# Patient Record
Sex: Male | Born: 1975 | Race: White | Hispanic: No | Marital: Single | Smoking: Current some day smoker
Health system: Southern US, Community
[De-identification: ages and names within clinical notes are randomized; demographics above are authoritative.]

## PROBLEM LIST (undated history)

## (undated) DIAGNOSIS — F419 Anxiety disorder, unspecified: Secondary | ICD-10-CM

## (undated) HISTORY — PX: HAND SURGERY: SHX662

## (undated) HISTORY — PX: HIP SURGERY: SHX245

## (undated) HISTORY — PX: BACK SURGERY: SHX140

---

## 2003-01-28 ENCOUNTER — Emergency Department (HOSPITAL_COMMUNITY): Admission: EM | Admit: 2003-01-28 | Discharge: 2003-01-29 | Payer: Self-pay | Admitting: Emergency Medicine

## 2003-02-13 ENCOUNTER — Emergency Department (HOSPITAL_COMMUNITY): Admission: EM | Admit: 2003-02-13 | Discharge: 2003-02-13 | Payer: Self-pay | Admitting: Emergency Medicine

## 2005-10-06 ENCOUNTER — Emergency Department (HOSPITAL_COMMUNITY): Admission: EM | Admit: 2005-10-06 | Discharge: 2005-10-06 | Payer: Self-pay | Admitting: Emergency Medicine

## 2005-12-18 ENCOUNTER — Emergency Department (HOSPITAL_COMMUNITY): Admission: EM | Admit: 2005-12-18 | Discharge: 2005-12-18 | Payer: Self-pay | Admitting: Emergency Medicine

## 2006-08-13 ENCOUNTER — Emergency Department (HOSPITAL_COMMUNITY): Admission: EM | Admit: 2006-08-13 | Discharge: 2006-08-13 | Payer: Self-pay | Admitting: Emergency Medicine

## 2007-07-20 ENCOUNTER — Emergency Department (HOSPITAL_COMMUNITY): Admission: EM | Admit: 2007-07-20 | Discharge: 2007-07-20 | Payer: Self-pay | Admitting: Emergency Medicine

## 2007-11-01 ENCOUNTER — Emergency Department (HOSPITAL_COMMUNITY): Admission: EM | Admit: 2007-11-01 | Discharge: 2007-11-01 | Payer: Self-pay | Admitting: Emergency Medicine

## 2008-02-13 ENCOUNTER — Emergency Department (HOSPITAL_COMMUNITY): Admission: EM | Admit: 2008-02-13 | Discharge: 2008-02-13 | Payer: Self-pay | Admitting: Emergency Medicine

## 2008-02-27 ENCOUNTER — Emergency Department (HOSPITAL_COMMUNITY): Admission: EM | Admit: 2008-02-27 | Discharge: 2008-02-27 | Payer: Self-pay | Admitting: Emergency Medicine

## 2008-06-14 ENCOUNTER — Emergency Department (HOSPITAL_COMMUNITY): Admission: EM | Admit: 2008-06-14 | Discharge: 2008-06-14 | Payer: Self-pay | Admitting: Emergency Medicine

## 2008-06-30 ENCOUNTER — Emergency Department (HOSPITAL_COMMUNITY): Admission: EM | Admit: 2008-06-30 | Discharge: 2008-06-30 | Payer: Self-pay | Admitting: Emergency Medicine

## 2009-01-07 ENCOUNTER — Emergency Department (HOSPITAL_COMMUNITY): Admission: EM | Admit: 2009-01-07 | Discharge: 2009-01-07 | Payer: Self-pay | Admitting: Emergency Medicine

## 2009-04-22 ENCOUNTER — Emergency Department (HOSPITAL_COMMUNITY): Admission: EM | Admit: 2009-04-22 | Discharge: 2009-04-22 | Payer: Self-pay | Admitting: Emergency Medicine

## 2010-01-11 ENCOUNTER — Emergency Department (HOSPITAL_COMMUNITY): Admission: EM | Admit: 2010-01-11 | Discharge: 2010-01-11 | Payer: Self-pay | Admitting: Emergency Medicine

## 2010-02-05 ENCOUNTER — Emergency Department (HOSPITAL_COMMUNITY): Admission: EM | Admit: 2010-02-05 | Discharge: 2010-02-05 | Payer: Self-pay | Admitting: Emergency Medicine

## 2010-06-05 LAB — BASIC METABOLIC PANEL
BUN: 14 mg/dL (ref 6–23)
CO2: 26 mEq/L (ref 19–32)
Calcium: 9.2 mg/dL (ref 8.4–10.5)
Chloride: 109 mEq/L (ref 96–112)
Creatinine, Ser: 0.85 mg/dL (ref 0.4–1.5)
GFR calc Af Amer: >60 mL/min (ref >60)
GFR calc non Af Amer: >60 mL/min (ref >60)
Glucose, Bld: 112 mg/dL — ABNORMAL HIGH (ref 70–99)
Potassium: 4.5 mEq/L (ref 3.5–5.1)
Sodium: 140 mEq/L (ref 135–145)

## 2010-06-05 LAB — URINALYSIS, ROUTINE W REFLEX MICROSCOPIC
Bilirubin Urine: NEGATIVE
Glucose, UA: NEGATIVE mg/dL
Hgb urine dipstick: NEGATIVE
Nitrite: NEGATIVE
Protein, ur: NEGATIVE mg/dL
Specific Gravity, Urine: 1.022 (ref 1.005–1.030)
Urobilinogen, UA: 1 mg/dL (ref 0.0–1.0)
pH: 7.5 (ref 5.0–8.0)

## 2010-06-05 LAB — DIFFERENTIAL
Basophils Absolute: 0 10*3/uL (ref 0.0–0.1)
Basophils Relative: 0 % (ref 0–1)
Eosinophils Absolute: 0.1 10*3/uL (ref 0.0–0.7)
Eosinophils Relative: 1 % (ref 0–5)
Lymphocytes Relative: 14 % (ref 12–46)
Lymphs Abs: 1.3 10*3/uL (ref 0.7–4.0)
Monocytes Absolute: 0.4 10*3/uL (ref 0.1–1.0)
Monocytes Relative: 4 % (ref 3–12)
Neutro Abs: 7.7 10*3/uL (ref 1.7–7.7)
Neutrophils Relative %: 81 % — ABNORMAL HIGH (ref 43–77)

## 2010-06-05 LAB — CBC
HCT: 45.8 % (ref 39.0–52.0)
Hemoglobin: 15.7 g/dL (ref 13.0–17.0)
MCH: 33.1 pg (ref 26.0–34.0)
MCHC: 34.3 g/dL (ref 30.0–36.0)
MCV: 96.5 fL (ref 78.0–100.0)
Platelets: 250 10*3/uL (ref 150–400)
RBC: 4.75 MIL/uL (ref 4.22–5.81)
RDW: 13.3 % (ref 11.5–15.5)
WBC: 9.5 10*3/uL (ref 4.0–10.5)

## 2010-06-05 LAB — LIPASE, BLOOD: Lipase: 23 U/L (ref 11–59)

## 2010-06-27 LAB — POCT CARDIAC MARKERS
CKMB, poc: <1 ng/mL — ABNORMAL LOW (ref 1.0–8.0)
Myoglobin, poc: 57.1 ng/mL (ref 12–200)
Troponin i, poc: <0.05 ng/mL (ref 0.00–0.09)

## 2010-07-03 LAB — POCT I-STAT, CHEM 8
BUN: 10 mg/dL (ref 6–23)
Calcium, Ion: 1.13 mmol/L (ref 1.12–1.32)
Chloride: 104 mEq/L (ref 96–112)
Creatinine, Ser: 1 mg/dL (ref 0.4–1.5)
Glucose, Bld: 81 mg/dL (ref 70–99)
HCT: 45 % (ref 39.0–52.0)
Hemoglobin: 15.3 g/dL (ref 13.0–17.0)
Potassium: 4.3 mEq/L (ref 3.5–5.1)
Sodium: 142 mEq/L (ref 135–145)
TCO2: 29 mmol/L (ref 0–100)

## 2010-07-03 LAB — CBC
HCT: 42.6 % (ref 39.0–52.0)
Hemoglobin: 14.6 g/dL (ref 13.0–17.0)
MCHC: 34.2 g/dL (ref 30.0–36.0)
MCV: 97.8 fL (ref 78.0–100.0)
Platelets: 217 10*3/uL (ref 150–400)
RBC: 4.35 MIL/uL (ref 4.22–5.81)
RDW: 13.1 % (ref 11.5–15.5)
WBC: 6 10*3/uL (ref 4.0–10.5)

## 2010-07-03 LAB — COMPREHENSIVE METABOLIC PANEL
ALT: 11 U/L (ref 0–53)
AST: 18 U/L (ref 0–37)
Albumin: 4.1 g/dL (ref 3.5–5.2)
Alkaline Phosphatase: 69 U/L (ref 39–117)
BUN: 8 mg/dL (ref 6–23)
CO2: 31 mEq/L (ref 19–32)
Calcium: 9.4 mg/dL (ref 8.4–10.5)
Chloride: 107 mEq/L (ref 96–112)
Creatinine, Ser: 0.97 mg/dL (ref 0.4–1.5)
GFR calc Af Amer: >60 mL/min (ref >60)
GFR calc non Af Amer: >60 mL/min (ref >60)
Glucose, Bld: 89 mg/dL (ref 70–99)
Potassium: 4.5 mEq/L (ref 3.5–5.1)
Sodium: 143 mEq/L (ref 135–145)
Total Bilirubin: 0.6 mg/dL (ref 0.3–1.2)
Total Protein: 6.4 g/dL (ref 6.0–8.3)

## 2010-07-03 LAB — DIFFERENTIAL
Basophils Absolute: 0 10*3/uL (ref 0.0–0.1)
Basophils Relative: 0 % (ref 0–1)
Eosinophils Absolute: 0.1 10*3/uL (ref 0.0–0.7)
Eosinophils Relative: 2 % (ref 0–5)
Lymphocytes Relative: 30 % (ref 12–46)
Lymphs Abs: 1.8 10*3/uL (ref 0.7–4.0)
Monocytes Absolute: 0.3 10*3/uL (ref 0.1–1.0)
Monocytes Relative: 6 % (ref 3–12)
Neutro Abs: 3.7 10*3/uL (ref 1.7–7.7)
Neutrophils Relative %: 62 % (ref 43–77)

## 2010-07-03 LAB — URINALYSIS, ROUTINE W REFLEX MICROSCOPIC
Bilirubin Urine: NEGATIVE
Glucose, UA: NEGATIVE mg/dL
Hgb urine dipstick: NEGATIVE
Nitrite: NEGATIVE
Protein, ur: NEGATIVE mg/dL
Specific Gravity, Urine: 1.023 (ref 1.005–1.030)
Urobilinogen, UA: 1 mg/dL (ref 0.0–1.0)
pH: 6.5 (ref 5.0–8.0)

## 2010-07-03 LAB — URINE CULTURE
Colony Count: NO GROWTH
Culture: NO GROWTH

## 2010-07-03 LAB — LIPASE, BLOOD: Lipase: 28 U/L (ref 11–59)

## 2010-07-03 LAB — PROTIME-INR
INR: 1.1 (ref 0.00–1.49)
Prothrombin Time: 14.4 seconds (ref 11.6–15.2)

## 2010-07-04 LAB — BASIC METABOLIC PANEL
BUN: 13 mg/dL (ref 6–23)
CO2: 26 mEq/L (ref 19–32)
Calcium: 9 mg/dL (ref 8.4–10.5)
Creatinine, Ser: 0.94 mg/dL (ref 0.4–1.5)
Glucose, Bld: 95 mg/dL (ref 70–99)

## 2010-07-04 LAB — DIFFERENTIAL
Basophils Absolute: 0 10*3/uL (ref 0.0–0.1)
Basophils Relative: 0 % (ref 0–1)
Eosinophils Absolute: 0.1 10*3/uL (ref 0.0–0.7)
Neutro Abs: 3.9 10*3/uL (ref 1.7–7.7)
Neutrophils Relative %: 64 % (ref 43–77)

## 2010-07-04 LAB — CBC
MCHC: 33.8 g/dL (ref 30.0–36.0)
RDW: 13.3 % (ref 11.5–15.5)

## 2011-01-23 ENCOUNTER — Emergency Department (HOSPITAL_COMMUNITY): Payer: Self-pay

## 2011-01-23 ENCOUNTER — Emergency Department (HOSPITAL_COMMUNITY)
Admission: EM | Admit: 2011-01-23 | Discharge: 2011-01-23 | Disposition: A | Discharge status: Home / Self Care | Payer: Self-pay | Attending: Emergency Medicine | Admitting: Emergency Medicine

## 2011-01-23 DIAGNOSIS — R4789 Other speech disturbances: Secondary | ICD-10-CM | POA: Insufficient documentation

## 2011-01-23 DIAGNOSIS — R29898 Other symptoms and signs involving the musculoskeletal system: Secondary | ICD-10-CM | POA: Insufficient documentation

## 2011-01-23 DIAGNOSIS — H547 Unspecified visual loss: Secondary | ICD-10-CM | POA: Insufficient documentation

## 2011-01-23 DIAGNOSIS — Z1389 Encounter for screening for other disorder: Secondary | ICD-10-CM | POA: Insufficient documentation

## 2011-01-23 DIAGNOSIS — R0602 Shortness of breath: Secondary | ICD-10-CM | POA: Insufficient documentation

## 2011-01-23 DIAGNOSIS — R51 Headache: Secondary | ICD-10-CM | POA: Insufficient documentation

## 2011-01-23 DIAGNOSIS — G935 Compression of brain: Secondary | ICD-10-CM | POA: Insufficient documentation

## 2011-01-23 DIAGNOSIS — G459 Transient cerebral ischemic attack, unspecified: Secondary | ICD-10-CM | POA: Insufficient documentation

## 2011-01-23 LAB — DIFFERENTIAL
Basophils Absolute: 0 10*3/uL (ref 0.0–0.1)
Basophils Relative: 1 % (ref 0–1)
Eosinophils Relative: 1 % (ref 0–5)
Lymphocytes Relative: 25 % (ref 12–46)
Neutro Abs: 3.8 10*3/uL (ref 1.7–7.7)

## 2011-01-23 LAB — RAPID URINE DRUG SCREEN, HOSP PERFORMED
Amphetamines: NOT DETECTED
Opiates: NOT DETECTED

## 2011-01-23 LAB — POCT I-STAT TROPONIN I: Troponin i, poc: 0.01 ng/mL (ref 0.00–0.08)

## 2011-01-23 LAB — BASIC METABOLIC PANEL
BUN: 13 mg/dL (ref 6–23)
Chloride: 103 mEq/L (ref 96–112)
Creatinine, Ser: 0.87 mg/dL (ref 0.50–1.35)
GFR calc Af Amer: >90 mL/min (ref >90)
GFR calc non Af Amer: >90 mL/min (ref >90)

## 2011-01-23 LAB — CBC
HCT: 41.5 % (ref 39.0–52.0)
Hemoglobin: 14.2 g/dL (ref 13.0–17.0)
RDW: 12.7 % (ref 11.5–15.5)
WBC: 5.7 10*3/uL (ref 4.0–10.5)

## 2011-01-23 LAB — D-DIMER, QUANTITATIVE: D-Dimer, Quant: <0.22 ug/mL-FEU (ref 0.00–0.48)

## 2011-01-24 NOTE — Consult Note (Signed)
NAMEMarland Kitchen  Dakota Smith, Dakota Smith NO.:  [id number]  MEDICAL RECORD NO.:  [id number]  LOCATION:  [facility]                         FACILITY:  [facility]  PHYSICIAN:  Jonny Ruiz, MD    DATE OF BIRTH:  1976/01/24  DATE OF CONSULTATION: DATE OF DISCHARGE:                                CONSULTATION   REQUESTING PHYSICIAN:  Juliet Rude. Rubin Payor, MD  REASON FOR CONSULTATION:  Headache.  HISTORY OF PRESENT ILLNESS:  The patient is a 35 year old man with a history of migraine headaches since he was a child, presented to the emergency department because of shortness of breath, dizziness, blurred vision, and headache after eating breakfast this morning.  In addition, the patient noticed difficulty ambulating and forming words, associated to his headache.  The patient states that he took a 16-hour trip last Saturday.  He came complaining of low back pain, knee pain, right calf pain and left shoulder pain.  In the ER, he received a dose of Percocet and got relief; however, his headache persisted.  The patient was assessed by Remi Haggard, family nurse practitioner in the emergency department and underwent a CT scan of the head, which was negative as well as a D-dimer which was negative.  He was incidentally found with marijuana in his urine.  Otherwise, his blood work was normal.  The patient tells me that 2 years ago, he had a similar headache associated to blurred vision and confusion, and his brother brought him to the hospital for an evaluation.  He was discharged home and was told that he was fine.  He has had migraine headaches since he was a child, for which he takes either a big cup of coffee or Excedrin Migraine with relief of the pain.  The patient denies head trauma, neck stiffness, fever, chills, or night sweats.  He has no skin rash or joint swelling.  Denies any exposure to insect bites or exposure to sick contacts.  PAST MEDICAL HISTORY: 1. Headache since he was a  child. 2. Asthma. 3. Gastritis. 4. Tobacco abuse.  SURGICAL HISTORY: 1. Back surgery. 2. Bilateral hand surgery. 3. Hip surgery.  MEDICATIONS:  None.  PRIMARY CARE PHYSICIAN:  Does not have a primary care physician currently.  ALLERGIES:  PENICILLIN.  FAMILY HISTORY:  Father had heart attacks and strokes in his mid to late 30s.  There is also a history of diabetes and hypertension in the family.  SOCIAL HISTORY:  The patient works with metal.  He smokes a pack per day for over 15 years.  Occasionally drinks alcohol (beer) and admits to smoking marijuana.  REVIEW OF SYSTEMS:  CONSTITUTIONAL:  No fever, chills, night sweats, fatigue, malaise, or weight loss.  ENT:  No sore throat, runny nose, earache, or sinus tenderness.  CARDIOVASCULAR:  Denies chest pain, palpitations, orthopnea, nocturia, or PND.  RESPIRATORY:  Larey Seat short of breath this morning, but feels better now.  Denies cough or wheezing. GASTROINTESTINAL:  Had some stomach upset when he developed a headache this morning, but now he feels better and in fact, he is hungry.  Denies vomiting.  No diarrhea or constipation.  No hematochezia or melena.  GU: Denies dysuria, frequency, or hematuria.  NEUROLOGIC:  Denies focal weakness or numbness.  However, as I said in the HPI, the patient has been felt difficulty ambulating by himself and his mother noticed that his speech was somewhat slurred.  At the time of my examination, his speech was back to normal and he was able to ambulate without difficulties.  PHYSICAL EXAMINATION:  VITAL SIGNS:  His BP is 110/75, pulse 60, respirations 15, temperature 97.9, and O2 saturation 99%. GENERAL APPEARANCE:  The patient is a slim white male who appears in no acute distress.  He is alert and oriented x3.  He is pleasant and cooperative. HEENT:  The patient tells me that he cannot see the numbers on the clock that is on the wall, but he is able to tell me the time by the hands  of the clock.  Funduscopic examination revealed normal cups and blood vessels with no exudates or hemorrhages.  Nose without drainage. Oropharynx is clear.  Ears, normal TMs and canals. NECK:  Supple without meningeal irritation.  No lymphadenopathy. HEART:  Regular S1 and S2 without gallops, murmurs, or rubs. LUNGS:  Clear to auscultation. ABDOMEN:  Soft, nontender, without organomegaly or masses palpable. EXTREMITIES:  Without clubbing, cyanosis, or edema. NEUROLOGICAL:  Alert and oriented x3.  Cranial nerves II-XII intact. Motor strength 5/5 in the upper and lower extremities.  Sensory intact. Finger-to-nose normal.  Heel-to-shin normal.  Romberg's negative. Ambulation normal.  Speech clear.  LABORATORY DATA:  CBC normal.  Basic metabolic panel normal.  D-dimer negative.  CT head normal.  MRI head findings; cerebellar tonsils are low lying, reaching the C1 ring with a pointed appearance suggestive of a Chiari malformation.  Limited imaging of the upper cervical spine without obvious syrinx.  No acute infarct.  No intracranial hemorrhage. No hydrocephalus.  No intracranial mass, lesion detected on this unenhanced exam.  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  The patient's symptoms have almost resolved in the emergency department and his headache is much better after he was given Percocet.  He feels much better and ready to go home.  He does not have any lesions requiring hospitalization today.  He can be safely discharged home.  He is advised to seek a primary care physician and Neurology consultation to follow up on his MRI results.  The patient was provided with full information regarding Chiari malformation including diagnosis,  treatment, and prognosis.  The patient is advised to seek a Neurology consultation as an outpatient.  He was provided with literature from the [facility] website regarding Chiari malformation and I have handed a report of his MRI to the patient's mother.  All answers were questioned to the best of my knowledge.  Thank you for allowing me to participate in the care of this very nice gentleman.          ______________________________ Jonny Ruiz, MD     GL/MEDQ  D:  01/23/2011  T:  01/23/2011  Job:  [id number]  Electronically Signed by Jonny Ruiz MD on 01/24/2011 02:22:19 PM

## 2013-08-20 ENCOUNTER — Emergency Department: Payer: Self-pay | Admitting: Emergency Medicine

## 2013-08-20 LAB — CBC
HCT: 46.9 % (ref 40.0–52.0)
HGB: 15.8 g/dL (ref 13.0–18.0)
MCH: 31 pg (ref 26.0–34.0)
MCHC: 33.6 g/dL (ref 32.0–36.0)
MCV: 92 fL (ref 80–100)
PLATELETS: 296 10*3/uL (ref 150–440)
RBC: 5.08 10*6/uL (ref 4.40–5.90)
RDW: 14.5 % (ref 11.5–14.5)
WBC: 13.4 10*3/uL — AB (ref 3.8–10.6)

## 2013-08-20 LAB — URINALYSIS, COMPLETE
BACTERIA: NONE SEEN
BILIRUBIN, UR: NEGATIVE
BLOOD: NEGATIVE
GLUCOSE, UR: NEGATIVE mg/dL (ref 0–75)
LEUKOCYTE ESTERASE: NEGATIVE
Nitrite: NEGATIVE
Ph: 5 (ref 4.5–8.0)
Protein: 30
SPECIFIC GRAVITY: 1.036 (ref 1.003–1.030)
Squamous Epithelial: NONE SEEN
WBC UR: 3 /HPF (ref 0–5)

## 2013-08-20 LAB — COMPREHENSIVE METABOLIC PANEL
ALBUMIN: 4.1 g/dL (ref 3.4–5.0)
ALK PHOS: 105 U/L
Anion Gap: 10 (ref 7–16)
BUN: 15 mg/dL (ref 7–18)
Bilirubin,Total: 0.5 mg/dL (ref 0.2–1.0)
CALCIUM: 9.2 mg/dL (ref 8.5–10.1)
CHLORIDE: 102 mmol/L (ref 98–107)
CO2: 24 mmol/L (ref 21–32)
Creatinine: 0.82 mg/dL (ref 0.60–1.30)
EGFR (African American): >60
Glucose: 157 mg/dL — ABNORMAL HIGH (ref 65–99)
Osmolality: 276 (ref 275–301)
Potassium: 3.7 mmol/L (ref 3.5–5.1)
SGOT(AST): 16 U/L (ref 15–37)
SGPT (ALT): 13 U/L (ref 12–78)
Sodium: 136 mmol/L (ref 136–145)
Total Protein: 8 g/dL (ref 6.4–8.2)

## 2013-08-20 LAB — LIPASE, BLOOD: Lipase: 98 U/L (ref 73–393)

## 2015-07-18 ENCOUNTER — Encounter: Payer: Self-pay | Admitting: Emergency Medicine

## 2015-07-18 ENCOUNTER — Emergency Department
Admission: EM | Admit: 2015-07-18 | Discharge: 2015-07-18 | Disposition: A | Discharge status: Home / Self Care | Payer: Self-pay | Attending: Emergency Medicine | Admitting: Emergency Medicine

## 2015-07-18 ENCOUNTER — Emergency Department: Payer: Self-pay

## 2015-07-18 DIAGNOSIS — W1839XA Other fall on same level, initial encounter: Secondary | ICD-10-CM | POA: Insufficient documentation

## 2015-07-18 DIAGNOSIS — Y939 Activity, unspecified: Secondary | ICD-10-CM | POA: Insufficient documentation

## 2015-07-18 DIAGNOSIS — Y929 Unspecified place or not applicable: Secondary | ICD-10-CM | POA: Insufficient documentation

## 2015-07-18 DIAGNOSIS — S62309A Unspecified fracture of unspecified metacarpal bone, initial encounter for closed fracture: Secondary | ICD-10-CM

## 2015-07-18 DIAGNOSIS — S62334A Displaced fracture of neck of fourth metacarpal bone, right hand, initial encounter for closed fracture: Secondary | ICD-10-CM | POA: Insufficient documentation

## 2015-07-18 DIAGNOSIS — F17200 Nicotine dependence, unspecified, uncomplicated: Secondary | ICD-10-CM | POA: Insufficient documentation

## 2015-07-18 DIAGNOSIS — Z7982 Long term (current) use of aspirin: Secondary | ICD-10-CM | POA: Insufficient documentation

## 2015-07-18 DIAGNOSIS — S62316A Displaced fracture of base of fifth metacarpal bone, right hand, initial encounter for closed fracture: Secondary | ICD-10-CM | POA: Insufficient documentation

## 2015-07-18 DIAGNOSIS — Y999 Unspecified external cause status: Secondary | ICD-10-CM | POA: Insufficient documentation

## 2015-07-18 HISTORY — DX: Anxiety disorder, unspecified: F41.9

## 2015-07-18 MED ORDER — OXYCODONE HCL 5 MG PO TABS: 5.0000 mg | ORAL_TABLET | Freq: Three times a day (TID) | ORAL | Status: AC | PRN

## 2015-07-18 MED ORDER — OXYCODONE HCL 5 MG PO TABS
5.0000 mg | ORAL_TABLET | Freq: Once | ORAL | Status: AC
  Administered 2015-07-18: 5 mg via ORAL

## 2015-07-18 MED ORDER — OXYCODONE HCL 5 MG PO TABS
ORAL_TABLET | ORAL | Status: AC
  Administered 2015-07-18: 5 mg via ORAL
  Filled 2015-07-18: qty 1

## 2015-07-18 NOTE — ED Notes (Signed)
States he fell   Landed on right hand   Swelling and tenderness noted  Abrasion noted to index finger

## 2015-07-18 NOTE — ED Provider Notes (Signed)
[facility] Emergency Department Provider Note ____________________________________________  Time seen: Approximately 10:27 AM  I have reviewed the triage vital signs and the nursing notes.   HISTORY  Chief Complaint Hand Injury    HPI Dakota Smith is a 40 y.o. male who presents to the emergency department for evaluation of right hand pain. He states that he is having issues with anxiety and panic attacks which this morning caused him to fall onto a square fence post. His right hand was underneath when he landed and now has severe pain and swelling to the back of his hand and an abrasion to the DIP of the right index finger. He is right hand dominant. He has a history of right hand fracture with surgical repair. He has not taken anything for pain. He denies loss of consciousness or striking his head/LOC.  Past Medical History  Diagnosis Date  . Anxiety     There are no active problems to display for this patient.   Past Surgical History  Procedure Laterality Date  . Hand surgery    . Back surgery    . Hip surgery Bilateral     Current Outpatient Rx  Name  Route  Sig  Dispense  Refill  . aspirin 81 MG tablet   Oral   Take 81 mg by mouth daily.         Marland Kitchen oxyCODONE (ROXICODONE) 5 MG immediate release tablet   Oral   Take 1 tablet (5 mg total) by mouth every 8 (eight) hours as needed.   12 tablet   0     Allergies Ibuprofen; Tylenol; and Penicillins  History reviewed. No pertinent family history.  Social History Social History  Substance Use Topics  . Smoking status: Current Some Day Smoker  . Smokeless tobacco: None  . Alcohol Use: No    Review of Systems Constitutional: No fever. Cardiovascular: Denies chest pain or palpitations. Respiratory: Denies shortness of breath. Gastrointestinal: No abdominal pain.  Musculoskeletal: Pain in right hand. Skin: Positive for abrasion to DIP of right index finger. Neurological: Negative  for headaches, focal weakness or numbness. ____________________________________________   PHYSICAL EXAM:  VITAL SIGNS: ED Triage Vitals  Enc Vitals Group     BP 07/18/15 1015 128/82 mmHg     Pulse Rate 07/18/15 1015 78     Resp 07/18/15 1015 20     Temp 07/18/15 1015 98 F (36.7 C)     Temp Source 07/18/15 1015 Oral     SpO2 07/18/15 1015 98 %     Weight 07/18/15 1014 182 lb (82.555 kg)     Height 07/18/15 1014  (1.778 m)     Head Cir --      Peak Flow --      Pain Score 07/18/15 1007 8     Pain Loc --      Pain Edu? --      Excl. in GC? --     Constitutional: Alert and oriented. Eyes: Conjunctivae are normal. EOMI. Head: Atraumatic. Respiratory: Normal respiratory effort.   Musculoskeletal: Edema at the base of the ring and small right metacarpal with bony tenderness to palpation. Bony tenderness also to the MCP area of the right ring finger. Full ROM of fingers without joint pain with the exception of the above.  Neurologic:  Normal speech and language. No gross focal neurologic deficits are appreciated. Speech is normal. No gait instability. Skin:  Skin is warm, dry and intact. Atraumatic. Psychiatric: Mood  and affect are normal. Speech and behavior are normal.  ____________________________________________   LABS (all labs ordered are listed, but only abnormal results are displayed)  Labs Reviewed - No data to display ____________________________________________  RADIOLOGY  Acute-appearing fractures involving the fourth metacarpal neck and base of the fifth metacarpal.  I, Jamarr Treinen, personally viewed and evaluated these images (plain radiographs) as part of my medical decision making, as well as reviewing the written report by the radiologist.  ____________________________________________   PROCEDURES  Procedure(s) performed:   SPLINT APPLICATION Date/Time: 1:37 PM Authorized by: Kem Boroughsari Aianna Fahs Consent: Verbal consent obtained. Risks and  benefits: risks, benefits and alternatives were discussed Consent given by: patient Splint applied by: Crystal, ER technician Location details: right hand Splint type: ulnar gutter Supplies used: OCL and ACE Post-procedure: The splinted body part was neurovascularly unchanged following the procedure. Patient tolerance: Patient tolerated the procedure well with no immediate complications.  Follow up will be greater than 24 hours. Initial fracture care provided. ___________________________________________   INITIAL IMPRESSION / ASSESSMENT AND PLAN / ED COURSE  Pertinent labs & imaging results that were available during my care of the patient were reviewed by me and considered in my medical decision making (see chart for details).  Patient to call and schedule a follow up with orthopedics. He was advised to return to the ER for symptoms that change or worsen. He was also given information regarding [facility] and [facility]. He states he intends to call [facility] today for an appointment.  ____________________________________________   FINAL CLINICAL IMPRESSION(S) / ED DIAGNOSES  Final diagnoses:  Fracture of metacarpal, closed, initial encounter       Chinita PesterCari B Mennie Spiller, FNP 07/18/15 1338  Arnaldo NatalPaul F Malinda, MD 07/18/15 1340

## 2015-07-18 NOTE — ED Notes (Signed)
Pt reports he fell on his rt hand this am, swelling noted.

## 2015-08-02 ENCOUNTER — Encounter: Payer: Self-pay | Admitting: Emergency Medicine

## 2015-08-02 ENCOUNTER — Inpatient Hospital Stay: Payer: Self-pay

## 2015-08-02 ENCOUNTER — Inpatient Hospital Stay
Admission: EM | Admit: 2015-08-02 | Discharge: 2015-08-23 | DRG: 296 | Disposition: E | Payer: Self-pay | Attending: Internal Medicine | Admitting: Internal Medicine

## 2015-08-02 ENCOUNTER — Emergency Department: Payer: Self-pay

## 2015-08-02 DIAGNOSIS — G931 Anoxic brain damage, not elsewhere classified: Secondary | ICD-10-CM | POA: Diagnosis present

## 2015-08-02 DIAGNOSIS — R569 Unspecified convulsions: Secondary | ICD-10-CM | POA: Diagnosis present

## 2015-08-02 DIAGNOSIS — N179 Acute kidney failure, unspecified: Secondary | ICD-10-CM | POA: Diagnosis present

## 2015-08-02 DIAGNOSIS — R000 Tachycardia, unspecified: Secondary | ICD-10-CM | POA: Diagnosis present

## 2015-08-02 DIAGNOSIS — Z515 Encounter for palliative care: Secondary | ICD-10-CM | POA: Diagnosis present

## 2015-08-02 DIAGNOSIS — G935 Compression of brain: Secondary | ICD-10-CM | POA: Diagnosis present

## 2015-08-02 DIAGNOSIS — Z7982 Long term (current) use of aspirin: Secondary | ICD-10-CM

## 2015-08-02 DIAGNOSIS — J690 Pneumonitis due to inhalation of food and vomit: Secondary | ICD-10-CM | POA: Diagnosis present

## 2015-08-02 DIAGNOSIS — Z79891 Long term (current) use of opiate analgesic: Secondary | ICD-10-CM

## 2015-08-02 DIAGNOSIS — R748 Abnormal levels of other serum enzymes: Secondary | ICD-10-CM | POA: Diagnosis present

## 2015-08-02 DIAGNOSIS — Z66 Do not resuscitate: Secondary | ICD-10-CM | POA: Diagnosis present

## 2015-08-02 DIAGNOSIS — E876 Hypokalemia: Secondary | ICD-10-CM | POA: Diagnosis present

## 2015-08-02 DIAGNOSIS — F17200 Nicotine dependence, unspecified, uncomplicated: Secondary | ICD-10-CM | POA: Diagnosis present

## 2015-08-02 DIAGNOSIS — J9601 Acute respiratory failure with hypoxia: Secondary | ICD-10-CM | POA: Diagnosis present

## 2015-08-02 DIAGNOSIS — F419 Anxiety disorder, unspecified: Secondary | ICD-10-CM | POA: Diagnosis present

## 2015-08-02 DIAGNOSIS — I469 Cardiac arrest, cause unspecified: Principal | ICD-10-CM | POA: Diagnosis present

## 2015-08-02 DIAGNOSIS — R4189 Other symptoms and signs involving cognitive functions and awareness: Secondary | ICD-10-CM

## 2015-08-02 DIAGNOSIS — G253 Myoclonus: Secondary | ICD-10-CM | POA: Diagnosis present

## 2015-08-02 DIAGNOSIS — R570 Cardiogenic shock: Secondary | ICD-10-CM | POA: Diagnosis present

## 2015-08-02 DIAGNOSIS — Z888 Allergy status to other drugs, medicaments and biological substances status: Secondary | ICD-10-CM

## 2015-08-02 DIAGNOSIS — I10 Essential (primary) hypertension: Secondary | ICD-10-CM | POA: Diagnosis present

## 2015-08-02 DIAGNOSIS — Z880 Allergy status to penicillin: Secondary | ICD-10-CM

## 2015-08-02 DIAGNOSIS — K7200 Acute and subacute hepatic failure without coma: Secondary | ICD-10-CM | POA: Diagnosis present

## 2015-08-02 DIAGNOSIS — F1010 Alcohol abuse, uncomplicated: Secondary | ICD-10-CM | POA: Diagnosis present

## 2015-08-02 DIAGNOSIS — Z4659 Encounter for fitting and adjustment of other gastrointestinal appliance and device: Secondary | ICD-10-CM

## 2015-08-02 DIAGNOSIS — E872 Acidosis: Secondary | ICD-10-CM | POA: Diagnosis present

## 2015-08-02 DIAGNOSIS — N390 Urinary tract infection, site not specified: Secondary | ICD-10-CM | POA: Diagnosis present

## 2015-08-02 DIAGNOSIS — R739 Hyperglycemia, unspecified: Secondary | ICD-10-CM | POA: Diagnosis present

## 2015-08-02 DIAGNOSIS — J9602 Acute respiratory failure with hypercapnia: Secondary | ICD-10-CM | POA: Diagnosis present

## 2015-08-02 DIAGNOSIS — G936 Cerebral edema: Secondary | ICD-10-CM | POA: Diagnosis present

## 2015-08-02 LAB — CBC
HEMATOCRIT: 41.7 % (ref 40.0–52.0)
HEMATOCRIT: 46.7 % (ref 40.0–52.0)
Hemoglobin: 13.8 g/dL (ref 13.0–18.0)
Hemoglobin: 15.3 g/dL (ref 13.0–18.0)
MCH: 31.7 pg (ref 26.0–34.0)
MCH: 31.4 pg (ref 26.0–34.0)
MCHC: 33.1 g/dL (ref 32.0–36.0)
MCHC: 32.7 g/dL (ref 32.0–36.0)
MCV: 95.7 fL (ref 80.0–100.0)
MCV: 96.1 fL (ref 80.0–100.0)
Platelets: 241 10*3/uL (ref 150–440)
Platelets: 230 10*3/uL (ref 150–440)
RBC: 4.35 MIL/uL — ABNORMAL LOW (ref 4.40–5.90)
RBC: 4.86 MIL/uL (ref 4.40–5.90)
RDW: 13.7 % (ref 11.5–14.5)
RDW: 13.6 % (ref 11.5–14.5)
WBC: 7.2 10*3/uL (ref 3.8–10.6)
WBC: 22.8 10*3/uL — AB (ref 3.8–10.6)

## 2015-08-02 LAB — COMPREHENSIVE METABOLIC PANEL
ALT: 64 U/L — ABNORMAL HIGH (ref 17–63)
AST: 70 U/L — ABNORMAL HIGH (ref 15–41)
Albumin: 4.1 g/dL (ref 3.5–5.0)
Alkaline Phosphatase: 69 U/L (ref 38–126)
Anion gap: 12 (ref 5–15)
BUN: 16 mg/dL (ref 6–20)
CHLORIDE: 102 mmol/L (ref 101–111)
CO2: 22 mmol/L (ref 22–32)
Calcium: 8.5 mg/dL — ABNORMAL LOW (ref 8.9–10.3)
Creatinine, Ser: 1.47 mg/dL — ABNORMAL HIGH (ref 0.61–1.24)
GFR, EST NON AFRICAN AMERICAN: 59 mL/min — AB (ref >60)
Glucose, Bld: 349 mg/dL — ABNORMAL HIGH (ref 65–99)
POTASSIUM: 3.3 mmol/L — AB (ref 3.5–5.1)
Sodium: 136 mmol/L (ref 135–145)
Total Bilirubin: 0.2 mg/dL — ABNORMAL LOW (ref 0.3–1.2)
Total Protein: 6.9 g/dL (ref 6.5–8.1)

## 2015-08-02 LAB — URINE DRUG SCREEN, QUALITATIVE (ARMC ONLY)
AMPHETAMINES, UR SCREEN: NOT DETECTED
BENZODIAZEPINE, UR SCRN: NOT DETECTED
Barbiturates, Ur Screen: NOT DETECTED
Cannabinoid 50 Ng, Ur ~~LOC~~: NOT DETECTED
Cocaine Metabolite,Ur ~~LOC~~: NOT DETECTED
MDMA (Ecstasy)Ur Screen: NOT DETECTED
METHADONE SCREEN, URINE: NOT DETECTED
Opiate, Ur Screen: NOT DETECTED
PHENCYCLIDINE (PCP) UR S: NOT DETECTED
Tricyclic, Ur Screen: NOT DETECTED

## 2015-08-02 LAB — URINALYSIS COMPLETE WITH MICROSCOPIC (ARMC ONLY)
Bacteria, UA: NONE SEEN
Bilirubin Urine: NEGATIVE
Glucose, UA: >500 mg/dL — AB
Hgb urine dipstick: NEGATIVE
Ketones, ur: NEGATIVE mg/dL
Leukocytes, UA: NEGATIVE
Nitrite: NEGATIVE
PROTEIN: 100 mg/dL — AB
Specific Gravity, Urine: 1.017 (ref 1.005–1.030)
Squamous Epithelial / LPF: NONE SEEN
pH: 5 (ref 5.0–8.0)

## 2015-08-02 LAB — BLOOD GAS, ARTERIAL
Acid-base deficit: 12.7 mmol/L — ABNORMAL HIGH (ref 0.0–2.0)
BICARBONATE: 17.7 meq/L — AB (ref 21.0–28.0)
FIO2: 80
LHR: 18 {breaths}/min
MECHVT: 580 mL
O2 SAT: 75.4 %
PATIENT TEMPERATURE: 37
PCO2 ART: 57 mmHg — AB (ref 32.0–48.0)
PEEP/CPAP: 5 cmH2O
PH ART: 7.1 — AB (ref 7.350–7.450)
PO2 ART: 56 mmHg — AB (ref 83.0–108.0)

## 2015-08-02 LAB — MRSA PCR SCREENING: MRSA by PCR: NEGATIVE

## 2015-08-02 LAB — SALICYLATE LEVEL

## 2015-08-02 LAB — TROPONIN I
TROPONIN I: 1.38 ng/mL — AB (ref <0.031)
TROPONIN I: 2.93 ng/mL — AB (ref <0.031)

## 2015-08-02 LAB — GLUCOSE, CAPILLARY
GLUCOSE-CAPILLARY: 245 mg/dL — AB (ref 65–99)
Glucose-Capillary: 268 mg/dL — ABNORMAL HIGH (ref 65–99)

## 2015-08-02 LAB — LACTIC ACID, PLASMA
LACTIC ACID, VENOUS: 7.3 mmol/L — AB (ref 0.5–2.0)
Lactic Acid, Venous: 3.4 mmol/L (ref 0.5–2.0)

## 2015-08-02 LAB — APTT: aPTT: 26 seconds (ref 24–36)

## 2015-08-02 LAB — PROTIME-INR
INR: 1.1
Prothrombin Time: 14.4 seconds (ref 11.4–15.0)

## 2015-08-02 LAB — ETHANOL

## 2015-08-02 LAB — ACETAMINOPHEN LEVEL

## 2015-08-02 MED ORDER — DEXTROSE 5 % IV SOLN: 1.0000 g | INTRAVENOUS | Status: DC

## 2015-08-02 MED ORDER — FENTANYL CITRATE (PF) 100 MCG/2ML IJ SOLN: 100.0000 ug | Freq: Once | INTRAMUSCULAR | Status: DC | PRN

## 2015-08-02 MED ORDER — ARTIFICIAL TEARS OP OINT
1.0000 "application " | TOPICAL_OINTMENT | Freq: Three times a day (TID) | OPHTHALMIC | Status: DC
  Administered 2015-08-02 – 2015-08-04 (×7): 1 via OPHTHALMIC
  Filled 2015-08-02: qty 3.5

## 2015-08-02 MED ORDER — VECURONIUM BROMIDE 10 MG IV SOLR
0.8000 ug/kg/min | INTRAVENOUS | Status: DC
  Filled 2015-08-02: qty 100

## 2015-08-02 MED ORDER — FENTANYL 2500MCG IN NS 250ML (10MCG/ML) PREMIX INFUSION
25.0000 ug/h | INTRAVENOUS | Status: DC
  Filled 2015-08-02: qty 250

## 2015-08-02 MED ORDER — PROPOFOL 1000 MG/100ML IV EMUL
25.0000 ug/kg/min | INTRAVENOUS | Status: DC
  Administered 2015-08-02: 35 ug/kg/min via INTRAVENOUS
  Filled 2015-08-02: qty 100

## 2015-08-02 MED ORDER — PROPOFOL 1000 MG/100ML IV EMUL
25.0000 ug/kg/min | INTRAVENOUS | Status: DC
  Administered 2015-08-03 (×2): 35 ug/kg/min via INTRAVENOUS
  Administered 2015-08-03: 25 ug/kg/min via INTRAVENOUS
  Administered 2015-08-03: 30 ug/kg/min via INTRAVENOUS
  Filled 2015-08-02 (×4): qty 100

## 2015-08-02 MED ORDER — ASPIRIN 300 MG RE SUPP
300.0000 mg | RECTAL | Status: AC
  Administered 2015-08-02: 300 mg via RECTAL
  Filled 2015-08-02: qty 1

## 2015-08-02 MED ORDER — SODIUM CHLORIDE 0.9 % IV SOLN
50.0000 mg | Freq: Two times a day (BID) | INTRAVENOUS | Status: DC
  Administered 2015-08-02 – 2015-08-04 (×4): 50 mg via INTRAVENOUS
  Filled 2015-08-02 (×7): qty 5

## 2015-08-02 MED ORDER — FENTANYL 2500MCG IN NS 250ML (10MCG/ML) PREMIX INFUSION: 100.0000 ug/h | INTRAVENOUS | Status: DC

## 2015-08-02 MED ORDER — FENTANYL BOLUS VIA INFUSION
50.0000 ug | INTRAVENOUS | Status: DC | PRN
  Filled 2015-08-02: qty 50

## 2015-08-02 MED ORDER — CHLORHEXIDINE GLUCONATE 0.12% ORAL RINSE (MEDLINE KIT)
15.0000 mL | Freq: Two times a day (BID) | OROMUCOSAL | Status: DC
  Administered 2015-08-02 – 2015-08-04 (×4): 15 mL via OROMUCOSAL
  Filled 2015-08-02 (×6): qty 15

## 2015-08-02 MED ORDER — VECURONIUM BOLUS VIA INFUSION
0.0800 mg/kg | Freq: Once | INTRAVENOUS | Status: DC
  Filled 2015-08-02: qty 7

## 2015-08-02 MED ORDER — PANTOPRAZOLE SODIUM 40 MG IV SOLR: 40.0000 mg | Freq: Every day | INTRAVENOUS | Status: DC

## 2015-08-02 MED ORDER — CEFTRIAXONE SODIUM 1 G IJ SOLR
1.0000 g | Freq: Once | INTRAMUSCULAR | Status: AC
  Administered 2015-08-02: 1 g via INTRAVENOUS
  Filled 2015-08-02: qty 10

## 2015-08-02 MED ORDER — SODIUM CHLORIDE 0.9 % IV SOLN
1000.0000 mL | Freq: Once | INTRAVENOUS | Status: AC
  Administered 2015-08-02: 1000 mL via INTRAVENOUS

## 2015-08-02 MED ORDER — HEPARIN SODIUM (PORCINE) 5000 UNIT/ML IJ SOLN: 5000.0000 [IU] | Freq: Three times a day (TID) | INTRAMUSCULAR | Status: DC

## 2015-08-02 MED ORDER — FENTANYL BOLUS VIA INFUSION
50.0000 ug | INTRAVENOUS | Status: DC | PRN
Start: 2015-08-02 — End: 2015-08-04
  Filled 2015-08-02: qty 50

## 2015-08-02 MED ORDER — STERILE WATER FOR INJECTION IJ SOLN
INTRAMUSCULAR | Status: AC
  Administered 2015-08-02: 19:00:00
  Filled 2015-08-02: qty 10

## 2015-08-02 MED ORDER — AMIODARONE HCL IN DEXTROSE 360-4.14 MG/200ML-% IV SOLN
60.0000 mg/h | INTRAVENOUS | Status: AC
  Administered 2015-08-02: 60 mg/h via INTRAVENOUS
  Filled 2015-08-02 (×2): qty 200

## 2015-08-02 MED ORDER — NOREPINEPHRINE BITARTRATE 1 MG/ML IV SOLN: 0.0000 ug/min | INTRAVENOUS | Status: DC

## 2015-08-02 MED ORDER — FENTANYL CITRATE (PF) 100 MCG/2ML IJ SOLN: 100.0000 ug | Freq: Once | INTRAMUSCULAR | Status: DC

## 2015-08-02 MED ORDER — NOREPINEPHRINE 4 MG/250ML-% IV SOLN
0.0000 ug/min | INTRAVENOUS | Status: DC
Start: 2015-08-02 — End: 2015-08-05
  Administered 2015-08-03: 8 ug/min via INTRAVENOUS
  Administered 2015-08-04 (×2): 7 ug/min via INTRAVENOUS
  Filled 2015-08-02 (×3): qty 250

## 2015-08-02 MED ORDER — VECURONIUM BROMIDE 10 MG IV SOLR
10.0000 mg | Freq: Once | INTRAVENOUS | Status: AC
  Administered 2015-08-02: 10 mg via INTRAVENOUS

## 2015-08-02 MED ORDER — VECURONIUM BROMIDE 10 MG IV SOLR
0.8000 ug/kg/min | INTRAVENOUS | Status: DC
  Administered 2015-08-03: 0.8 ug/kg/min via INTRAVENOUS
  Filled 2015-08-02: qty 100

## 2015-08-02 MED ORDER — ANTISEPTIC ORAL RINSE SOLUTION (CORINZ)
7.0000 mL | OROMUCOSAL | Status: DC
  Administered 2015-08-02 – 2015-08-04 (×19): 7 mL via OROMUCOSAL
  Filled 2015-08-02 (×25): qty 7

## 2015-08-02 MED ORDER — FENTANYL 2500MCG IN NS 250ML (10MCG/ML) PREMIX INFUSION
100.0000 ug/h | INTRAVENOUS | Status: DC
  Administered 2015-08-03: 150 ug/h via INTRAVENOUS
  Filled 2015-08-02: qty 250

## 2015-08-02 MED ORDER — CEFTRIAXONE SODIUM 2 G IJ SOLR
2.0000 g | INTRAMUSCULAR | Status: DC
  Filled 2015-08-02: qty 2

## 2015-08-02 MED ORDER — PROPOFOL 1000 MG/100ML IV EMUL: 5.0000 ug/kg/min | INTRAVENOUS | Status: DC

## 2015-08-02 MED ORDER — SODIUM CHLORIDE 0.9 % IV SOLN
1000.0000 mg | Freq: Two times a day (BID) | INTRAVENOUS | Status: DC
  Administered 2015-08-02 – 2015-08-04 (×5): 1000 mg via INTRAVENOUS
  Filled 2015-08-02 (×7): qty 10

## 2015-08-02 MED ORDER — PROPOFOL 1000 MG/100ML IV EMUL
INTRAVENOUS | Status: AC
  Filled 2015-08-02: qty 100

## 2015-08-02 MED ORDER — STERILE WATER FOR INJECTION IJ SOLN
INTRAMUSCULAR | Status: AC
  Administered 2015-08-02: 22:00:00
  Filled 2015-08-02: qty 10

## 2015-08-02 MED ORDER — SODIUM CHLORIDE 0.9 % IV SOLN: 2000.0000 mL | Freq: Once | INTRAVENOUS | Status: DC

## 2015-08-02 MED ORDER — AMIODARONE HCL IN DEXTROSE 360-4.14 MG/200ML-% IV SOLN
30.0000 mg/h | INTRAVENOUS | Status: DC
  Administered 2015-08-02 – 2015-08-03 (×2): 30 mg/h via INTRAVENOUS
  Filled 2015-08-02 (×5): qty 200

## 2015-08-02 MED ORDER — SODIUM CHLORIDE 0.9 % IV SOLN: 250.0000 mL | INTRAVENOUS | Status: DC | PRN

## 2015-08-02 MED ORDER — VECURONIUM BROMIDE 10 MG IV SOLR
10.0000 mg | INTRAVENOUS | Status: DC
  Administered 2015-08-02 (×2): 10 mg via INTRAVENOUS
  Filled 2015-08-02 (×3): qty 10

## 2015-08-02 MED ORDER — PROPOFOL 1000 MG/100ML IV EMUL: 25.0000 ug/kg/min | INTRAVENOUS | Status: DC

## 2015-08-02 MED ORDER — VECURONIUM BROMIDE 10 MG IV SOLR: 10.0000 mg | INTRAVENOUS | Status: DC | PRN

## 2015-08-02 MED ORDER — HEPARIN SODIUM (PORCINE) 5000 UNIT/ML IJ SOLN
5000.0000 [IU] | Freq: Three times a day (TID) | INTRAMUSCULAR | Status: DC
  Administered 2015-08-02 – 2015-08-04 (×7): 5000 [IU] via SUBCUTANEOUS
  Filled 2015-08-02 (×7): qty 1

## 2015-08-02 MED ORDER — PROPOFOL 1000 MG/100ML IV EMUL
5.0000 ug/kg/min | Freq: Once | INTRAVENOUS | Status: DC
  Administered 2015-08-02: 16:00:00 via INTRAVENOUS

## 2015-08-02 MED ORDER — PANTOPRAZOLE SODIUM 40 MG IV SOLR
40.0000 mg | Freq: Every day | INTRAVENOUS | Status: DC
  Administered 2015-08-02 – 2015-08-03 (×2): 40 mg via INTRAVENOUS
  Filled 2015-08-02 (×2): qty 40

## 2015-08-02 MED ORDER — FENTANYL 2500MCG IN NS 250ML (10MCG/ML) PREMIX INFUSION
100.0000 ug/h | INTRAVENOUS | Status: DC
  Administered 2015-08-02: 100 ug/h via INTRAVENOUS

## 2015-08-02 NOTE — ED Notes (Signed)
1st L of cool NS started on patient.

## 2015-08-02 NOTE — ED Notes (Signed)
Called code ice per dr. Cyril Loosenkinner [phone]

## 2015-08-02 NOTE — ED Notes (Signed)
Pt back from CT; intensivist Casa in room. MD refusing ice packs and 2nd L of cool NS. No new orders given.

## 2015-08-02 NOTE — Progress Notes (Signed)
Pharmacy Antibiotic Note  Renae GlossJeffery R Ocain is a 40 y.o. male admitted on 08/01/2015 s/p PEA with UTI.  Pharmacy has been consulted for ceftriaxone dosing.  Plan: Ceftriaxone 1 g iv once given. Will order ceftriaxone  additional 1 g then 2 g iv q 24 hours with higher dosing for r/o sepsis.   Weight: 180 lb 12.4 oz (82 kg)  Temp (24hrs), Avg:100.2 F (37.9 C), Min:99.4 F (37.4 C), Max:100.9 F (38.3 C)   Recent Labs Lab 08/20/2015 1543  WBC 7.2  CREATININE 1.47*  LATICACIDVEN 7.3*    Estimated Creatinine Clearance: 71 mL/min (by C-G formula based on Cr of 1.47).    Allergies  Allergen Reactions  . Ibuprofen Other (See Comments)    Gi bleed  . Tylenol [Acetaminophen] Other (See Comments)    Vomits blood  . Penicillins Rash    Antimicrobials this admission: ceftriaxone 5/11 >>   Dose adjustments this admission:   Microbiology results: BCx: pending UCx: pending MRSA PCR: pending  Thank you for allowing pharmacy to be a part of this patient's care.  Luisa HartChristy, Taralee Marcus D 08/03/2015 7:03 PM

## 2015-08-02 NOTE — ED Provider Notes (Signed)
[facility] Emergency Department Provider Note  ____________________________________________    I have reviewed the triage vital signs and the nursing notes.   HISTORY  Chief Complaint Cardiac Arrest  History is per EMS  HPI Dakota Smith is a 40 y.o. male who presents status post cardiac arrest. EMS reports patient was found down in the [employer] bathroom, no drug paraphernalia was seen. Apparently lost pulses while they were on site, they started ACLS, 3 rounds of epi, and had ROSC. Patient presents tachycardic and hypertensive and unresponsive. EMS placed a Brooke Dare airway     Past Medical History  Diagnosis Date  . Anxiety     Patient Active Problem List   Diagnosis Date Noted  . PEA (Pulseless electrical activity) (HCC) 2015-08-28    Past Surgical History  Procedure Laterality Date  . Hand surgery    . Back surgery    . Hip surgery Bilateral     Current Outpatient Rx  Name  Route  Sig  Dispense  Refill  . aspirin 81 MG tablet   Oral   Take 81 mg by mouth daily.         Marland Kitchen oxyCODONE (ROXICODONE) 5 MG immediate release tablet   Oral   Take 1 tablet (5 mg total) by mouth every 8 (eight) hours as needed.   12 tablet   0     Allergies Ibuprofen; Tylenol; and Penicillins  No family history on file.  Social History Social History  Substance Use Topics  . Smoking status: Current Some Day Smoker  . Smokeless tobacco: None  . Alcohol Use: No    Level V caveat: Unable to obtain Review of Systems, patient unresponsive     ____________________________________________   PHYSICAL EXAM:  VITAL SIGNS: ED Triage Vitals  Enc Vitals Group     BP Aug 28, 2015 1553 184/141 mmHg     Pulse Rate 08/28/15 1553 42     Resp 08-28-2015 1553 34     Temp Aug 28, 2015 1630 99.4 F (37.4 C)     Temp src --      SpO2 08/28/2015 1542 100 %     Weight 08/28/2015 1609 180 lb 12.4 oz (82 kg)     Height --      Head Cir --      Peak Flow --      Pain  Score --      Pain Loc --      Pain Edu? --      Excl. in GC? --     Constitutional: Unresponsive Eyes: Pupils fixed and dilated ENT   Head: Normocephalic and atraumatic.   Mouth/Throat: Mucous membranes are moist. Cardiovascular: Tachycardia, symmetric distal pulses present in the upper extremities Respiratory: King airway in place, breath sounds bilaterally Gastrointestinal:  No distention.  Genitourinary: Normal Musculoskeletal: No injuries noted to the lower extremities Neurologic:  Unresponsive Skin:  Skin is warm, dry and intact.   ____________________________________________    LABS (pertinent positives/negatives)  Labs Reviewed  CBC - Abnormal; Notable for the following:    RBC 4.35 (*)    All other components within normal limits  COMPREHENSIVE METABOLIC PANEL - Abnormal; Notable for the following:    Potassium 3.3 (*)    Glucose, Bld 349 (*)    Creatinine, Ser 1.47 (*)    Calcium 8.5 (*)    AST 70 (*)    ALT 64 (*)    Total Bilirubin 0.2 (*)    GFR calc non Af Denyse Dago  59 (*)    All other components within normal limits  URINALYSIS COMPLETEWITH MICROSCOPIC ([facility] ONLY) - Abnormal; Notable for the following:    Color, Urine YELLOW (*)    APPearance CLOUDY (*)    Glucose, UA >500 (*)    Protein, ur 100 (*)    All other components within normal limits  LACTIC ACID, PLASMA - Abnormal; Notable for the following:    Lactic Acid, Venous 7.3 (*)    All other components within normal limits  ACETAMINOPHEN LEVEL - Abnormal; Notable for the following:    Acetaminophen (Tylenol), Serum <10 (*)    All other components within normal limits  BLOOD GAS, ARTERIAL - Abnormal; Notable for the following:    pH, Arterial 7.10 (*)    pCO2 arterial 57 (*)    pO2, Arterial 56 (*)    Bicarbonate 17.7 (*)    Acid-base deficit 12.7 (*)    All other components within normal limits  MRSA PCR SCREENING  CULTURE, BLOOD (ROUTINE X 2)  CULTURE, BLOOD (ROUTINE X 2)  URINE  CULTURE  TROPONIN I  ETHANOL  URINE DRUG SCREEN, QUALITATIVE ([facility] ONLY)  SALICYLATE LEVEL  LACTIC ACID, PLASMA  CBC  CREATININE, SERUM    ____________________________________________   EKG  ED ECG REPORT I, Jene Every, the attending physician, personally viewed and interpreted this ECG.   Date: Aug 04, 2015  EKG Time: 3:38 PM  Rate: 146  Rhythm: Suspect atrial fibrillation  Axis: Normal  Intervals:none  ST&T Change: Nonspecific ST changes   ____________________________________________    RADIOLOGY  Chest x-ray shows ET tube in place him in no PTX CT shows hypoxic brain injury ____________________________________________   PROCEDURES  Procedure(s) performed: yes  INTUBATION Performed by: Jene Every  Required items: required blood products, implants, devices, and special equipment available Patient identity confirmed: provided demographic data and hospital-assigned identification number Time out: Immediately prior to procedure a "time out" was called to verify the correct patient, procedure, equipment, support staff and site/side marked as required.  Indications: unresponsive  Intubation method: 3Glidescope Laryngoscopy   Preoxygenation: BVM   Tube Size: 8.0 cuffed  Post-procedure assessment: chest rise and ETCO2 monitor Breath sounds: equal and absent over the epigastrium Tube secured with: ETT holder Chest x-ray interpreted by radiologist and me.  Chest x-ray findings: endotracheal tube in appropriate position   no immediate complications.     Critical Care performed: yes  CRITICAL CARE Performed by: Jene Every   Total critical care time: 50 minutes  Critical care time was exclusive of separately billable procedures and treating other patients.  Critical care was necessary to treat or prevent imminent or life-threatening deterioration.  Critical care was time spent personally by me on the following activities: development of  treatment plan with patient and/or surrogate as well as nursing, discussions with consultants, evaluation of patient's response to treatment, examination of patient, obtaining history from patient or surrogate, ordering and performing treatments and interventions, ordering and review of laboratory studies, ordering and review of radiographic studies, pulse oximetry and re-evaluation of patient's condition.   ____________________________________________   INITIAL IMPRESSION / ASSESSMENT AND PLAN / ED COURSE  Pertinent labs & imaging results that were available during my care of the patient were reviewed by me and considered in my medical decision making (see chart for details).  Patient presents status post cardiac arrest. Intubated in the emergency department. He arrives hypertensive and tachycardic and unresponsive. Discussed with critical care doctor Kasa who agrees with cooling the patient. Discussed  with Dr. Lady GaryFath of cardiology, no urgent cath at this time.  CT shows hypoxic brain injury. Dr. Belia HemanKasa is at the bedside.  Discussed with wife, grave prognosis ____________________________________________   FINAL CLINICAL IMPRESSION(S) / ED DIAGNOSES  Final diagnoses:  Cardiac arrest (HCC)  Anoxic brain injury (HCC)          Jene Everyobert Gavyn Zoss, MD 07/29/2015 1657

## 2015-08-02 NOTE — Progress Notes (Signed)
   07/27/2015 2300  Clinical Encounter Type  Visited With Family  Visit Type Initial  Referral From Nurse  Consult/Referral To Chaplain  Spiritual Encounters  Spiritual Needs Emotional  Stress Factors  Family Stress Factors Major life changes  Advance Directives (For Healthcare)  Does patient have an advance directive? No  Chaplain provided pastoral care to patients wife.   Fisher ScientificChaplain Davarious Tumbleson [phone]

## 2015-08-02 NOTE — ED Notes (Signed)
Patient transported to CT 

## 2015-08-02 NOTE — Progress Notes (Signed)
   10/08/2015 1630  Adult Ventilator Settings  FiO2 (%) 100 %  PEEP 12 cmH20  Adult Ventilator Measurements  SpO2 (!) 87 %  Increased peep to 12 per Dr. Belia HemanKasa verbal order.

## 2015-08-02 NOTE — ED Notes (Signed)
CCU NP at bedside to initiate central line.

## 2015-08-02 NOTE — Consult Note (Signed)
[facility] CLINIC CARDIOLOGY A [facility]  CARDIOLOGY CONSULT NOTE  Patient ID: Dakota Smith MRN: [id number] DOB/AGE: 06-08-1975 39 y.o.  Admit date: 08/20/2015 Referring Physician Dr. Cyril Loosen Primary Physician   Primary Cardiologist   Reason for Consultation s/p pea arrest  HPI: Patient is a 40 year old male with no cardiac history, history of tobacco abuse who was found down in the floor of the [employer] bathroom. EMTs were summoned and found him to be asystolic. He was given 2 mg nasal Narcan, history of 4 mg of IV Narcan, 3 mg of epinephrine and 1 mg of atropine. CPR was initiated with spontaneous 3 of circulation in 15 minutes by report. Urine tox screen was negative for opiates and barbiturates. EKG on arrival in the emergency room showed sinus tachycardia with no evidence of ST elevation. There was some what appeared to be rate related ST depression. Initial troponin 0.03. Patient had fixed pupils. Head CT revealed diffuse low attenuation with loss of gray white matter differentiation throughout the cerebral hemispheres suggestive of early transforaminal herniation compatible with edema from anoxic brain. Patient is intubated and unable to give history.  Review of Systems  Unable to perform ROS: intubated    Past Medical History  Diagnosis Date  . Anxiety     No family history on file.  Social History   Social History  . Marital Status: Single    Spouse Name: N/A  . Number of Children: N/A  . Years of Education: N/A   Occupational History  . Not on file.   Social History Main Topics  . Smoking status: Current Some Day Smoker  . Smokeless tobacco: Not on file  . Alcohol Use: No  . Drug Use: Not on file  . Sexual Activity: Not on file   Other Topics Concern  . Not on file   Social History Narrative    Past Surgical History  Procedure Laterality Date  . Hand surgery    . Back surgery    . Hip surgery Bilateral       (Not in a hospital  admission)  Physical Exam: Blood pressure 169/129, pulse 151, temperature 99.4 F (37.4 C), resp. rate 21, weight 82 kg (180 lb 12.4 oz), SpO2 87 %.   Wt Readings from Last 1 Encounters:  08/13/2015 82 kg (180 lb 12.4 oz)     General appearance: Intubated and unresponsive  Resp: Unresponsive Cardio: Sinus tachycardia Neurologic: Mental status: Unresponsive, pupils fixed  Labs:   Lab Results  Component Value Date   WBC 7.2 08/09/2015   HGB 13.8 08/19/2015   HCT 41.7 08/22/2015   MCV 95.7 08/01/2015   PLT 241 07/23/2015    Recent Labs Lab 08/06/2015 1543  NA 136  K 3.3*  CL 102  CO2 22  BUN 16  CREATININE 1.47*  CALCIUM 8.5*  PROT 6.9  BILITOT 0.2*  ALKPHOS 69  ALT 64*  AST 70*  GLUCOSE 349*   Lab Results  Component Value Date   TROPONINI <0.03 08/11/2015      Radiology: Borderline cardiomegaly with no pulmonary edema  EKG: Sinus tachycardia with no injury current status post epinephrine  ASSESSMENT AND PLAN:  40 year old male status post arrest. Noted to be in asystole on arrival of EMS. Given appendectomy and atropine and Narcan. Currently is in sinus tachycardia and hypertensive. Brain CT shows anoxic encephalopathy changes. Initial troponin was normal. Discussion with ER physician, intensive care unit team, will place in code ice protocol. Does not appear to  be a candidate for urgent cardiac catheterization due to CT findings. We'll follow along with you. Signed: Dalia HeadingFATH,Miguelangel Korn A. MD, [facility] 08/17/2015, 4:57 PM

## 2015-08-02 NOTE — Progress Notes (Signed)
eLink Physician-Brief Progress Note Patient Name: Renae GlossJeffery R Zima DOB: [birth year] MRN: [id number]   Date of Service  27-Nov-2015  HPI/Events of Note  Respiratory acidosis.  eICU Interventions  Increased RR to 22.  Will f/u ABG.     Intervention Category Major Interventions: Other:  Tray Klayman 27-Nov-2015, 8:27 PM

## 2015-08-02 NOTE — Progress Notes (Signed)
Wife and mother came back and spent time with patient and were updated on care by this RN. Artic Sun in use to cool patient to desired ordered temperature of 36 degrees celcius.  [facility], RN who is now taking over patient's care.

## 2015-08-02 NOTE — ED Notes (Signed)
[facility] [place]. Fire department gave 2mg  of narcan IN, when EMS arrived pt went into asystole. EMS reports giving 4mg  more of narcan, 3 epis and 1 atropine. CPR was initially started for 15 min and then pt was placed on lucas device for 10 minutes.EMS reports fire department inserted king airway; 20g EJ placed in left neck, 24g IO placed in right lower leg. Pt has ROSC before arrival to ED.

## 2015-08-02 NOTE — Procedures (Signed)
Central Venous Catheter Placement: Indication: Patient receiving vesicant or irritant drug.; Patient receiving intravenous therapy for longer than 5 days.; Patient has limited or no vascular access.   Consent: emergent   Hand washing performed prior to starting the procedure.   Procedure: An active timeout was performed and correct patient, name, & ID confirmed.  After explaining risk and benefits, patient was positioned correctly for central venous access. Patient was prepped using strict sterile technique including chlorohexadine preps, sterile drape, sterile gown and sterile gloves.  The area was prepped, draped and anesthetized in the usual sterile manner. Patient comfort was obtained.  A triple lumen catheter was placed in RT Internal Jugular Vein There was good blood return, catheter caps were placed on lumens, catheter flushed easily, the line was secured and a sterile dressing and BIO-PATCH applied.   Ultrasound was used to visualize vasculature and guidance of needle.   Number of Attempts: 1 Complications:none Estimated Blood Loss: none Chest Radiograph indicated and ordered.  Operator: Laronn Devonshire/Varughese   Lucie LeatherKurian David Verner Kopischke, M.D.  [facility]  Medical Director [facility] Medical Director [facility] Cardio[facility] Department

## 2015-08-02 NOTE — H&P (Signed)
PULMONARY / CRITICAL CARE MEDICINE   Name: Dakota GlossJeffery R Smith MRN: [id number] DOB: [birth year]    ADMISSION DATE:  2015-09-03 CONSULTATION DATE: 2015-09-03  REFERRING MD: EDP  CHIEF COMPLAINT:  PEA arrest  HISTORY OF PRESENT ILLNESS:   Dakota Smith, Dakota Smith is a 40 year old male with no prior significant medical history, is a tobacco abuser. Patient was brought [facility] ER [facility] on 5/11 by EMS. [facility] [place]. When EMS arrived to the scene patient was in asystole. File department gave 2 mg of Narcan prior to EMS on the scene. EMS gave 4 mg of Narcan, 3 mg of epinephrine and 1 mg of atropine. CPR was initiated with return of spontaneous circulation in 15 minutes. Upon arrival to the ER his ABG 7.10/57/56/17.7 sodium-136, K-3.3, Troponins-<0.03, lactic acid 7.3,wbc -7.2, platelets- 241, Creatinine-1.47, BUN-16, UDS was negative for any substance abuse.  PAST MEDICAL HISTORY :  He  has a past medical history of Anxiety.  PAST SURGICAL HISTORY: He  has past surgical history that includes Hand surgery; Back surgery; and Hip surgery (Bilateral).  Allergies  Allergen Reactions  . Ibuprofen Other (See Comments)    Gi bleed  . Tylenol [Acetaminophen] Other (See Comments)    Vomits blood  . Penicillins Rash    No current facility-administered medications on file prior to encounter.   Current Outpatient Prescriptions on File Prior to Encounter  Medication Sig  . aspirin 81 MG tablet Take 81 mg by mouth daily.  Marland Kitchen. oxyCODONE (ROXICODONE) 5 MG immediate release tablet Take 1 tablet (5 mg total) by mouth every 8 (eight) hours as needed.    FAMILY HISTORY:  His has no family status information on file.   SOCIAL HISTORY: He  reports that he has been smoking.  He does not have any smokeless tobacco history on file. He reports that he does not drink alcohol.  REVIEW OF SYSTEMS:   Unable to obtain  SUBJECTIVE:  Unable to  obtain VITAL SIGNS: BP 130/102 mmHg  Pulse 164  Temp(Src) 100.3 F (37.9 C)  Resp 23  Wt 82 kg (180 lb 12.4 oz)  SpO2 89%  HEMODYNAMICS:    VENTILATOR SETTINGS: Vent Mode:  [-] AC FiO2 (%):  [60 %-100 %] 100 % Set Rate:  [18 bmp] 18 bmp Vt Set:  [580 mL] 580 mL PEEP:  [5 cmH20-12 cmH20] 12 cmH20  INTAKE / OUTPUT:    PHYSICAL EXAMINATION: General: sickly appearing , white young male found intubated Neuro:  Obtunded HEENT: atraumatic, normocephalic, no discharge noted, no JVD appreciated Cardiovascular:S1S2, tachycardic, regular, no MRG noted Lungs:  Clear bilaterally , no wheezes, crackles, rhonchi noted Abdomen: soft, nontender, active bowel sounds Musculoskeletal:  No inflamation or deformity noted Skin:  Grossly intact.  LABS:  BMET  Recent Labs Lab 07/08/15 1543  NA 136  K 3.3*  CL 102  CO2 22  BUN 16  CREATININE 1.47*  GLUCOSE 349*    Electrolytes  Recent Labs Lab 07/08/15 1543  CALCIUM 8.5*    CBC  Recent Labs Lab 07/08/15 1543  WBC 7.2  HGB 13.8  HCT 41.7  PLT 241    Coag's No results for input(s): APTT, INR in the last 168 hours.  Sepsis Markers  Recent Labs Lab 07/08/15 1543  LATICACIDVEN 7.3*    ABG  Recent Labs Lab 07/08/15 1629  PHART 7.10*  PCO2ART 57*  PO2ART 56*    Liver Enzymes  Recent Labs Lab 07/08/15 1543  AST 70*  ALT 64*  ALKPHOS 69  BILITOT 0.2*  ALBUMIN 4.1    Cardiac Enzymes  Recent Labs Lab 08/14/15 1543  TROPONINI <0.03    Glucose No results for input(s): GLUCAP in the last 168 hours.  Imaging Ct Head Wo Contrast  2015-08-14  CLINICAL DATA:  Patient unresponsive. Patient status post code with asystole and CPR. EXAM: CT HEAD WITHOUT CONTRAST TECHNIQUE: Contiguous axial images were obtained from the base of the skull through the vertex without intravenous contrast. COMPARISON:  None. FINDINGS: There is loss of gray-white differentiation throughout the cerebral hemispheres  bilaterally compatible with extensive edema. Effacement of the sulci with mass effect exerted on the lateral ventricles bilaterally. High attenuation near the base of brain likely secondary to mass effect, favored to represent pseudo-subarachnoid hemorrhage. Findings suggestive of early transforaminal herniation. Orbits are unremarkable. Mild mucosal thickening ethmoid air cells and frontal sinus. Mastoid air cells are unremarkable. Calvarium is intact. IMPRESSION: Diffuse low attenuation and loss of gray-white differentiation throughout the cerebral hemispheres bilaterally most compatible with edema from anoxic brain injury. Findings suggestive of early transforaminal herniation. Critical Value/emergent results were called by telephone at the time of interpretation on Aug 14, 2015 at 4:30 pm to Dr. Jene Every , who verbally acknowledged these results. Electronically Signed   By: Annia Belt M.D.   On: Aug 14, 2015 16:32   Dg Chest Portable 1 View  08/14/15  CLINICAL DATA:  Unresponsive, CPR EXAM: PORTABLE CHEST 1 VIEW COMPARISON:  08/14/15 FINDINGS: Endotracheal tip 2.2 cm above the carina. Cardiac silhouette obscured by spinal rods as well as medical devices Hazy density over both lungs bilaterally no pleural effusion. IMPRESSION: Endotracheal tube as described. Hazy bilateral parenchymal opacity possibly representing pulmonary edema. Electronically Signed   By: Esperanza Heir M.D.   On: Aug 14, 2015 17:26   Dg Chest Portable 1 View  2015-08-14  CLINICAL DATA:  Post intubation post CPR, found unresponsive EXAM: PORTABLE CHEST 1 VIEW COMPARISON:  None. FINDINGS: Borderline cardiomegaly. Metallic fixation rods are noted thoracolumbar spine. There is endotracheal tube in place with tip 3.2 cm above the carina. No pneumothorax. No infiltrate or pulmonary edema. Mild dextroscoliosis thoracolumbar spine. IMPRESSION: Borderline cardiomegaly. No gross infiltrate or pulmonary edema. Endotracheal tube in place. No  pneumothorax. Electronically Signed   By: Natasha Mead M.D.   On: 08/14/2015 16:05     STUDIES:  5/11CT HEAD>>Diffuse low attenuation and loss of gray-white differentiationthroughout the cerebral hemispheres bilaterally most compatible withedema from anoxic brain injury. Findings suggestive of earlytransforaminal herniation  5/11 UDS> Ethyl alcohol<5, otherwise negative for opiates.  CULTURES: 5/11 BC>> 5/11 UC>>  ANTIBIOTICS: 5/11 Ceftriaxone>>  SIGNIFICANT EVENTS: 5/11 > PEA arrest  LINES/TUBES: 5/11 ET tube>> 5/11 Rt ij>>  DISCUSSION: 40 year old male with no significant prior medical history was PEA arrested, now intubated and mechanically ventilated. On hypothermia protocol post cardiac arrest.  ASSESSMENT / PLAN:  PULMONARY A: Acute hypoxemic/hypercarbic respiratory failure related to PEA arrest History of tobacco abuse P:   Vent. Full support  versedl/fentanyl /vecuronium  Routine ABG  CBC in a.m. CXR in a.m. CARDIOVASCULAR A:  No active issues P:  On telemetry  Monitor vital signs No indication for emergent cardiac catheterisation RENAL A:   Acute kidney injury UTI P:   rocephin Follow chemistry GASTROINTESTINAL A:   No active issue Elevated liver enzymes r/t alcohol abuse P:   Follow ALT,AST protonix for GI prophylaxis  HEMATOLOGIC A:   No active issues Hypokalemia P:  Replace electrolytes per icu protocol  Transfuse if HgB<7  INFECTIOUS A:   UTI Elevated lactic acid P:   Rocephin Follow CBC Trend lactic acid  ENDOCRINE A:   Hyperglycemia  NO ACTIVE ISSUES P:   Blood sugar checks intermittently  NEUROLOGIC A:   Anoxic brain injury Myoclonic seizures P:   RASS goal:-2 Hypothermia protocol Versed/vecuronium Kepra/ vimpat CT head indicative of cerebral edema with early signs of herniation.    FAMILY  - Updates: family present and updated  - Inter-disciplinary family meet or Palliative Care meeting due by:  5/17   Bincy Varughese,AG-ACNP Pulmonary and Critical Care Medicine [facility]   19-Aug-2015, 5:41 PM   STAFF NOTE: I, Dr. Lucie Leather,  have personally reviewed patient's available data, including medical history, events of note, physical examination and test results as part of my evaluation. I have discussed with NP and other care providers such as pharmacist, RN and RRT.  In addition,  I personally evaluated patient and elicited key findings   +myoclinc jerks, pupils dilated, breathing over set rate  A:acute cardiac arrest unknown etiology patient with severe acidosis and showing signs of severe anoxic brain injury  P: Will continue hypothermia protocol, obtain neurology consult, follow-up cardiology consult, family updated  prognosis is poor, high risk for death       The Rest per NP whose note is outlined above and that I agree with  I have personally reviewed/obtained a history, examined the patient, evaluated Pertinent laboratory and RadioGraphic/imaging results, and  formulated the assessment and plan   The Patient requires high complexity decision making for assessment and support, frequent evaluation and titration of therapies, application of advanced monitoring technologies and extensive interpretation of multiple databases. Critical Care Time devoted to patient care services described in this note is 65 minutes.   This Critical care time does not reflrect procedure time or supervisory time of NP but could involve care discussion time Overall, patient is critically ill, prognosis is guarded.  Patient with Multiorgan failure and at high risk for cardiac arrest and death.    Lucie Leather, M.D.  [facility]  Medical Director [facility] Medical Director [facility] Cardio-Pulmonary Department

## 2015-08-02 NOTE — Progress Notes (Addendum)
Per Dr. Belia HemanKasa RN is to discontinue Saline boluses and MD gave order that per Xray central line is ready for use.

## 2015-08-03 ENCOUNTER — Inpatient Hospital Stay: Payer: Self-pay

## 2015-08-03 DIAGNOSIS — I469 Cardiac arrest, cause unspecified: Secondary | ICD-10-CM

## 2015-08-03 LAB — PHOSPHORUS: Phosphorus: 3.2 mg/dL (ref 2.5–4.6)

## 2015-08-03 LAB — MAGNESIUM: Magnesium: 1.8 mg/dL (ref 1.7–2.4)

## 2015-08-03 LAB — BASIC METABOLIC PANEL
ANION GAP: 15 (ref 5–15)
Anion gap: 12 (ref 5–15)
BUN: 24 mg/dL — ABNORMAL HIGH (ref 6–20)
BUN: 24 mg/dL — AB (ref 6–20)
CALCIUM: 8.6 mg/dL — AB (ref 8.9–10.3)
CHLORIDE: 106 mmol/L (ref 101–111)
CO2: 15 mmol/L — AB (ref 22–32)
CO2: 16 mmol/L — ABNORMAL LOW (ref 22–32)
CREATININE: 1.35 mg/dL — AB (ref 0.61–1.24)
Calcium: 8.8 mg/dL — ABNORMAL LOW (ref 8.9–10.3)
Chloride: 106 mmol/L (ref 101–111)
Creatinine, Ser: 1.51 mg/dL — ABNORMAL HIGH (ref 0.61–1.24)
GFR calc Af Amer: >60 mL/min (ref >60)
GFR, EST NON AFRICAN AMERICAN: 57 mL/min — AB (ref >60)
Glucose, Bld: 190 mg/dL — ABNORMAL HIGH (ref 65–99)
Glucose, Bld: 153 mg/dL — ABNORMAL HIGH (ref 65–99)
Potassium: 3.8 mmol/L (ref 3.5–5.1)
Potassium: 3.8 mmol/L (ref 3.5–5.1)
SODIUM: 134 mmol/L — AB (ref 135–145)
Sodium: 136 mmol/L (ref 135–145)

## 2015-08-03 LAB — GLUCOSE, CAPILLARY
GLUCOSE-CAPILLARY: 136 mg/dL — AB (ref 65–99)
GLUCOSE-CAPILLARY: 252 mg/dL — AB (ref 65–99)
Glucose-Capillary: 253 mg/dL — ABNORMAL HIGH (ref 65–99)
Glucose-Capillary: 223 mg/dL — ABNORMAL HIGH (ref 65–99)
Glucose-Capillary: 178 mg/dL — ABNORMAL HIGH (ref 65–99)
Glucose-Capillary: 177 mg/dL — ABNORMAL HIGH (ref 65–99)
Glucose-Capillary: 188 mg/dL — ABNORMAL HIGH (ref 65–99)
Glucose-Capillary: 158 mg/dL — ABNORMAL HIGH (ref 65–99)
Glucose-Capillary: 126 mg/dL — ABNORMAL HIGH (ref 65–99)

## 2015-08-03 LAB — PROTIME-INR
INR: 1.18
Prothrombin Time: 15.2 seconds — ABNORMAL HIGH (ref 11.4–15.0)

## 2015-08-03 LAB — TROPONIN I
TROPONIN I: 5.14 ng/mL — AB (ref <0.031)
Troponin I: 3.27 ng/mL — ABNORMAL HIGH (ref <0.031)

## 2015-08-03 MED ORDER — INSULIN ASPART 100 UNIT/ML ~~LOC~~ SOLN
0.0000 [IU] | SUBCUTANEOUS | Status: DC
  Administered 2015-08-03: 3 [IU] via SUBCUTANEOUS
  Administered 2015-08-03: 2 [IU] via SUBCUTANEOUS
  Administered 2015-08-03: 3 [IU] via SUBCUTANEOUS
  Administered 2015-08-03: 2 [IU] via SUBCUTANEOUS
  Administered 2015-08-03: 3 [IU] via SUBCUTANEOUS
  Administered 2015-08-04: 2 [IU] via SUBCUTANEOUS
  Administered 2015-08-04: 3 [IU] via SUBCUTANEOUS
  Administered 2015-08-04: 2 [IU] via SUBCUTANEOUS
  Filled 2015-08-03 (×2): qty 2
  Filled 2015-08-03 (×3): qty 3
  Filled 2015-08-03: qty 2
  Filled 2015-08-03: qty 3
  Filled 2015-08-03: qty 2

## 2015-08-03 MED ORDER — DEXTROSE 5 % IV SOLN
1.0000 g | INTRAVENOUS | Status: DC
  Administered 2015-08-03 – 2015-08-04 (×2): 1 g via INTRAVENOUS
  Filled 2015-08-03 (×2): qty 10

## 2015-08-03 MED ORDER — INSULIN REGULAR HUMAN 100 UNIT/ML IJ SOLN
INTRAMUSCULAR | Status: DC
  Administered 2015-08-03: 2 [IU]/h via INTRAVENOUS
  Administered 2015-08-03: 2.4 [IU]/h via INTRAVENOUS
  Filled 2015-08-03: qty 2.5

## 2015-08-03 MED ORDER — ATROPINE SULFATE 1 MG/10ML IJ SOSY
PREFILLED_SYRINGE | INTRAMUSCULAR | Status: AC
Start: 2015-08-03 — End: 2015-08-04
  Filled 2015-08-03: qty 10

## 2015-08-03 MED ORDER — ATROPINE SULFATE 1 MG/10ML IJ SOSY
0.5000 mg | PREFILLED_SYRINGE | Freq: Once | INTRAMUSCULAR | Status: AC
  Administered 2015-08-03: 0.5 mg via INTRAVENOUS

## 2015-08-03 MED ORDER — INSULIN REGULAR BOLUS VIA INFUSION: 0.0000 [IU] | Freq: Three times a day (TID) | INTRAVENOUS | Status: DC

## 2015-08-03 MED ORDER — SODIUM CHLORIDE 0.9 % IV SOLN
INTRAVENOUS | Status: DC
  Administered 2015-08-03 – 2015-08-04 (×3): via INTRAVENOUS

## 2015-08-03 MED ORDER — DEXTROSE 50 % IV SOLN: 25.0000 mL | INTRAVENOUS | Status: DC | PRN

## 2015-08-03 MED ORDER — LORAZEPAM 2 MG/ML IJ SOLN
2.0000 mg | INTRAMUSCULAR | Status: DC | PRN
  Administered 2015-08-03: 2 mg via INTRAVENOUS
  Filled 2015-08-03: qty 1

## 2015-08-03 NOTE — Progress Notes (Addendum)
[facility] Progess Note    ASSESSMENT/PLAN    Summary: 40 year old male with no significant prior medical history was PEA arrested, now intubated and mechanically ventilated. On hypothermia protocol post cardiac arrest. Now with anoxic encephalopathy, cerebral edema and early signs herniation.   Addendum: I had a discussion with the patient's wife and mother today regarding the patient's status and prognosis. I relayed the patient's grim prognosis, they had already had an understanding of this from the previous discussions with other physicians. I reiterated this point and discussed possibly making the patient DO NOT RESUSCITATE. They tell me that they were going to discuss this amongst themselves and family members, they think that they were probably in agreement but would like to think it over for a few minutes. The patient's wife told me that she is going to think about it after she calls a few other family members and then get back to me with her response.   --Also wife noted that she knew someone at the [facility], she confirms that the patient was in the bathroom for about 20 min before he was found, EMS arrived about 10 min later.   ASSESSMENT / PLAN:  PULMONARY A: Acute hypoxemic/hypercarbic respiratory failure related to PEA arrest-Hypoxemia is now improved on the ventilator. History of tobacco abuse Chest x-ray images 5/12 reviewed: Adequately positioned life-support devices, there is mild right-sided infiltrate, which may be consistent with aspiration versus reperfusion edema. P:  Vent. Full support . Current vent settings PRVC/30/580/12/60%; we will wean down FiO2. Recheck ABG  -Continue full vent support, the patient is not yet a candidate for consideration of weaning.  CARDIOVASCULAR A:  Status post cardiac arrest, with sinus tachycardia. Elevated troponin at 2.93>> 5.14, likely secondary to cardiac arrest P:  Cardiology following,  patient is currently on an amiodarone infusion, will continue.  RENAL A:  Acute kidney injury. Creatinine 1.47>> 1.51 today Metabolic acidosis, secondary to cardiac arrest. UTI  P:  rocephin Follow chemistry Continue IV fluids.  GASTROINTESTINAL A:  No active issue Elevated liver enzymes r/t alcohol abuse and/or acute hepatopathy secondary to cardiac arrest\shock P:  Follow ALT,AST protonix for GI prophylaxis  HEMATOLOGIC A:  Leukocytosis, which may be reactive. Hypokalemia P:  Replace electrolytes per icu protocol Transfuse if HgB<7  INFECTIOUS A:  UTI Lactic acidosis, likely secondary to cardiac arrest. P:  Rocephin Follow CBC Trend lactic acid  Micro/culture results:  BCx2 5/11: pending UC 5/11: Pending Sputum-- MRSA screen 5/11: Negative  Antibiotics: Ceftriaxone 5/11>>  ENDOCRINE A:  Hyperglycemia , now on insulin drip. P:  Continue insulin infusion per protocol.  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  Myoclonic seizures-- now controlled.  P:  RASS goal:-2 Hypothermia protocol Versed/vecuronium Kepra/ vimpat CT head images from 5/11, reviewed: Consistent with diffuse brain edema.  MAJOR EVENTS/TEST RESULTS:   Best Practices  DVT Prophylaxis: Heparin GI Prophylaxis: Protonix   ---------------------------------------   ----------------------------------------   Name: Dakota Smith MRN: [id number] DOB: Sep 02, 1975    ADMISSION DATE:  08/03/2015   SUBJECTIVE:   Pt currently on the ventilator, can not provide history or review of systems.     VITAL SIGNS: Temp:  [94.1 F (34.5 C)-100.9 F (38.3 C)] 94.3 F (34.6 C) (05/12 0600) Pulse Rate:  [42-176] 84 (05/12 0600) Resp:  [18-34] 30 (05/12 0600) BP: (96-184)/(71-141) 101/81 mmHg (05/12 0600) SpO2:  [85 %-100 %] 100 % (  05/12 0600) FiO2 (%):   [60 %-100 %] 60 % (05/12 0348) Weight:  [180 lb 12.4 oz (82 kg)-189 lb 9.5 oz (86 kg)] 189 lb 9.5 oz (86 kg) (05/11 1915) HEMODYNAMICS: CVP:  [13 mmHg-22 mmHg] 13 mmHg VENTILATOR SETTINGS: Vent Mode:  [-] PRVC FiO2 (%):  [60 %-100 %] 60 % Set Rate:  [18 bmp-30 bmp] 30 bmp Vt Set:  [580 mL] 580 mL PEEP:  [5 cmH20-12 cmH20] 12 cmH20 INTAKE / OUTPUT:  Intake/Output Summary (Last 24 hours) at 08/03/15 0730 Last data filed at 08/03/15 0600  Gross per 24 hour  Intake 1037.22 ml  Output    580 ml  Net 457.22 ml    PHYSICAL EXAMINATION: Physical Examination:   VS: BP 101/81 mmHg  Pulse 84  Temp(Src) 94.3 F (34.6 C) (Other (Comment))  Resp 30  Ht 5\' 11"  (1.803 m)  Wt 189 lb 9.5 oz (86 kg)  BMI 26.45 kg/m2  SpO2 100%  General Appearance: No distress , Sedated Neuro:without focal findings, mental status , Unresponsive HEENT: PERRLA, EOM intact. ET tube in place Pulmonary: normal breath sounds   CardiovascularNormal S1,S2.  No m/r/g.   Abdomen: Benign, Soft, non-tender. Renal:  No costovertebral tenderness  GU:  Not performed at this time. Endocrine: No evident thyromegaly. Skin:   warm, no rashes, no ecchymosis  Extremities: normal, no cyanosis, clubbing.   LABS:   LABORATORY PANEL:   CBC  Recent Labs Lab 08/03/2015 2057  WBC 22.8*  HGB 15.3  HCT 46.7  PLT 230    Chemistries   Recent Labs Lab 08/12/2015 1543 08/03/15 0530  NA 136 136  K 3.3* 3.8  CL 102 106  CO2 22 15*  GLUCOSE 349* 190*  BUN 16 24*  CREATININE 1.47* 1.51*  CALCIUM 8.5* 8.6*  MG  --  1.8  PHOS  --  3.2  AST 70*  --   ALT 64*  --   ALKPHOS 69  --   BILITOT 0.2*  --      Recent Labs Lab 07/28/2015 2233 08/03/15 0007 08/03/15 0104 08/03/15 0200 08/03/15 0305 08/03/15 0356  GLUCAP 245* 252* 253* 223* 178* 177*    Recent Labs Lab 08/22/2015 1945 08/18/2015 2245 08/03/15 0300  PHART 7.13* 7.20* 7.26*  PCO2ART 57* 38 28*  PO2ART 73* 101 402*    Recent Labs Lab  08/08/2015 1543  AST 70*  ALT 64*  ALKPHOS 69  BILITOT 0.2*  ALBUMIN 4.1    Cardiac Enzymes  Recent Labs Lab 08/03/15 0530  TROPONINI 5.14*    RADIOLOGY:  Dg Abd 1 View  08/19/2015  CLINICAL DATA:  Orogastric tube placement EXAM: ABDOMEN - 1 VIEW COMPARISON:  None. FINDINGS: Orogastric tube tip and side port are in the proximal stomach. Bowel gas pattern is unremarkable. No demonstrable bowel obstruction or free air is seen. Lung bases are clear. IMPRESSION: Orogastric tube tip and side port in proximal stomach. Bowel gas pattern unremarkable. Electronically Signed   By: Bretta BangWilliam  Woodruff III M.D.   On: 08/07/2015 20:21   Ct Head Wo Contrast  08/14/2015  CLINICAL DATA:  Patient unresponsive. Patient status post code with asystole and CPR. EXAM: CT HEAD WITHOUT CONTRAST TECHNIQUE: Contiguous axial images were obtained from the base of the skull through the vertex without intravenous contrast. COMPARISON:  None. FINDINGS: There is loss of gray-white differentiation throughout the cerebral hemispheres bilaterally compatible with extensive edema. Effacement of the sulci with mass effect exerted on the lateral ventricles bilaterally. High attenuation  near the base of brain likely secondary to mass effect, favored to represent pseudo-subarachnoid hemorrhage. Findings suggestive of early transforaminal herniation. Orbits are unremarkable. Mild mucosal thickening ethmoid air cells and frontal sinus. Mastoid air cells are unremarkable. Calvarium is intact. IMPRESSION: Diffuse low attenuation and loss of gray-white differentiation throughout the cerebral hemispheres bilaterally most compatible with edema from anoxic brain injury. Findings suggestive of early transforaminal herniation. Critical Value/emergent results were called by telephone at the time of interpretation on 08-16-2015 at 4:30 pm to Dr. Jene Every , who verbally acknowledged these results. Electronically Signed   By: Annia Belt M.D.   On:  08-16-15 16:32   Dg Chest Port 1 View  08/03/2015  CLINICAL DATA:  Respiratory failure EXAM: PORTABLE CHEST 1 VIEW COMPARISON:  2015-08-16 FINDINGS: Endotracheal tube is 4.1 cm above the carina. The nasogastric tube extends into the stomach and beyond the inferior edge of the image. Hazy opacity in the central and basilar lung regions persists, probably unchanged from the previous day. No large effusion. IMPRESSION: Support equipment appears satisfactorily positioned. No significant interval change in the bilateral airspace opacities. Electronically Signed   By: Ellery Plunk M.D.   On: 08/03/2015 06:31   Dg Chest Portable 1 View  2015/08/16  CLINICAL DATA:  Unresponsive, CPR EXAM: PORTABLE CHEST 1 VIEW COMPARISON:  Aug 16, 2015 FINDINGS: Endotracheal tip 2.2 cm above the carina. Cardiac silhouette obscured by spinal rods as well as medical devices Hazy density over both lungs bilaterally no pleural effusion. IMPRESSION: Endotracheal tube as described. Hazy bilateral parenchymal opacity possibly representing pulmonary edema. Electronically Signed   By: Esperanza Heir M.D.   On: Aug 16, 2015 17:26   Dg Chest Portable 1 View  Aug 16, 2015  CLINICAL DATA:  Post intubation post CPR, found unresponsive EXAM: PORTABLE CHEST 1 VIEW COMPARISON:  None. FINDINGS: Borderline cardiomegaly. Metallic fixation rods are noted thoracolumbar spine. There is endotracheal tube in place with tip 3.2 cm above the carina. No pneumothorax. No infiltrate or pulmonary edema. Mild dextroscoliosis thoracolumbar spine. IMPRESSION: Borderline cardiomegaly. No gross infiltrate or pulmonary edema. Endotracheal tube in place. No pneumothorax. Electronically Signed   By: Natasha Mead M.D.   On: 08/16/15 16:05       --Wells Guiles, MD.  ICU Pager: [phone] [facility] Office Number: [phone]  Santiago Glad, M.D.  Stephanie Acre, M.D.  Billy Fischer, M.D  08/03/2015   Critical Care  Attestation.  I have personally obtained a history, examined the patient, evaluated laboratory and imaging results, formulated the assessment and plan and placed orders. The Patient requires high complexity decision making for assessment and support, frequent evaluation and titration of therapies, application of advanced monitoring technologies and extensive interpretation of multiple databases. The patient has critical illness that could lead imminently to failure of 1 or more organ systems and requires the highest level of physician preparedness to intervene.  Critical Care Time devoted to patient care services described in this note is 35 minutes and is exclusive of time spent in procedures.

## 2015-08-03 NOTE — Progress Notes (Signed)
Received in report this AM [place], [place] that the vec drip was ordered to stop during night shift due to patient not responding to TOF after the PRN vec dose was given yesterday. RN reported that the patient still had vec drip ordered incase of shivering. Dr. Nicholos Johnsamachandran was notified this AM of what was reported and made no changes to the order. Will continue to monitor patient.  Dakota Smith

## 2015-08-03 NOTE — Progress Notes (Signed)
Pt.'s HR dropped into low 40's sustaining, stopped amio gtt, Luci Bankukov, NP came bedside. Gave 0.5 mg of atropine- HR rose into 80's. Will continue to monitor pt. Closely, amio gtt to remain off per Luci Bankukov, NP

## 2015-08-03 NOTE — Progress Notes (Signed)
eLink Physician-Brief Progress Note Patient Name: Dakota GlossJeffery R Smith DOB: Sep 12, 1975 MRN: [id number]   Date of Service  08/03/2015  HPI/Events of Note  Ongoing metabolic acidosis with pH 7.20-/38/101/15.  eICU Interventions  Plan: Increase RR on vent to 30 Recheck ABG in 2 hours     Intervention Category Major Interventions: Acid-Base disturbance - evaluation and management  DETERDING,ELIZABETH 08/03/2015, 12:57 AM

## 2015-08-03 NOTE — Progress Notes (Signed)
fios dcreased to 70%

## 2015-08-03 NOTE — Consult Note (Signed)
Reason for Consult:Unresponsive s/p arrest Referring Physician: Mortimer Fries  CC: Unresponsive  HPI: Dakota Smith is an 40 y.o. male who is unable to provide history due to mental status.  All history obtained from the chart.  Patient found unresponsive at [facility] and brought to the ED on yesterday.  At [facility] the patient found to be asystolic.  Was given '4mg'$  of Narcan, '3mg'$  of epi and '1mg'$  of atropine.  CPR was initiated with return of ROSC after 15 minutes.  Patient has remained unresponsive.  Now cooled and sedated.    Past Medical History  Diagnosis Date  . Anxiety     Past Surgical History  Procedure Laterality Date  . Hand surgery    . Back surgery    . Hip surgery Bilateral     No family history on file.  Social History:  reports that he has been smoking.  He does not have any smokeless tobacco history on file. He reports that he does not drink alcohol. His drug history is not on file.  Allergies  Allergen Reactions  . Ibuprofen Other (See Comments)    Gi bleed  . Tylenol [Acetaminophen] Other (See Comments)    Vomits blood  . Penicillins Rash    Medications:  I have reviewed the patient's current medications. Prior to Admission:  Prescriptions prior to admission  Medication Sig Dispense Refill Last Dose  . aspirin 81 MG tablet Take 81 mg by mouth daily.   Past Week at Unknown time  . oxyCODONE (ROXICODONE) 5 MG immediate release tablet Take 1 tablet (5 mg total) by mouth every 8 (eight) hours as needed. 12 tablet 0 Past Month at Unknown time   Scheduled: . antiseptic oral rinse  7 mL Mouth Rinse 10 times per day  . artificial tears  1 application Both Eyes K1S  . cefTRIAXone (ROCEPHIN)  IV  2 g Intravenous Q24H  . chlorhexidine gluconate (SAGE KIT)  15 mL Mouth Rinse BID  . heparin  5,000 Units Subcutaneous Q8H  . insulin aspart  0-15 Units Subcutaneous Q4H  . lacosamide (VIMPAT) IV  50 mg Intravenous Q12H  . levETIRAcetam  1,000 mg Intravenous Q12H  .  pantoprazole (PROTONIX) IV  40 mg Intravenous QHS    ROS: History obtained from wife  General ROS: negative for - chills, fatigue, fever, night sweats, weight gain or weight loss Psychological ROS: negative for - behavioral disorder, hallucinations, memory difficulties, mood swings or suicidal ideation Ophthalmic ROS: negative for - blurry vision, double vision, eye pain or loss of vision ENT ROS: negative for - epistaxis, nasal discharge, oral lesions, sore throat, tinnitus or vertigo Allergy and Immunology ROS: negative for - hives or itchy/watery eyes Hematological and Lymphatic ROS: negative for - bleeding problems, bruising or swollen lymph nodes Endocrine ROS: negative for - galactorrhea, hair pattern changes, polydipsia/polyuria or temperature intolerance Respiratory ROS: negative for - cough, hemoptysis, shortness of breath or wheezing Cardiovascular ROS: negative for - chest pain, dyspnea on exertion, edema or irregular heartbeat Gastrointestinal ROS: negative for - abdominal pain, diarrhea, hematemesis, nausea/vomiting or stool incontinence Genito-Urinary ROS: negative for - dysuria, hematuria, incontinence or urinary frequency/urgency Musculoskeletal ROS: wrist pain due to fracture Neurological ROS: as noted in HPI Dermatological ROS: negative for rash and skin lesion changes  Physical Examination: Blood pressure 100/73, pulse 76, temperature 97.2 F (36.2 C), temperature source Core (Comment), resp. rate 30, height '5\' 11"'$  (1.803 m), weight 86 kg (189 lb 9.5 oz), SpO2 100 %.  HEENT-  Normocephalic, no lesions, without obvious abnormality.  Normal external eye and conjunctiva.  Normal TM's bilaterally.  Normal auditory canals and external ears. Normal external nose, mucus membranes and septum.  Normal pharynx. Cardiovascular- S1, S2 normal, pulses palpable throughout   Lungs- chest clear, no wheezing, rales, normal symmetric air entry Abdomen- soft, non-tender; bowel sounds  normal; no masses,  no organomegaly Extremities- no edema Lymph-no adenopathy palpable Musculoskeletal-no joint tenderness, deformity or swelling Skin-warm and dry, no hyperpigmentation, vitiligo, or suspicious lesions  Neurological Examination Mental Status: Patient does not respond to verbal stimuli.  Does not respond to deep sternal rub.  Does not follow commands.  No verbalizations noted.  Cranial Nerves: II: patient does not respond confrontation bilaterally, pupils right 2 mm, left 2 mm,and unreactive bilaterally III,IV,VI: doll's response absent bilaterally.  V,VII: corneal reflex absent bilaterally  VIII: patient does not respond to verbal stimuli IX,X: gag reflex reduced, XI: trapezius strength unable to test bilaterally XII: tongue strength unable to test Motor: Extremities flaccid throughout.  No spontaneous movement noted.  No purposeful movements noted. Sensory: Does not respond to noxious stimuli in any extremity. Deep Tendon Reflexes:  1+ in the upper extremities and absent in the lower extremities Plantars: mute bilaterally Cerebellar: Unable to perform      Laboratory Studies:   Basic Metabolic Panel:  Recent Labs Lab 08/12/2015 1543 08/03/15 0530  NA 136 136  K 3.3* 3.8  CL 102 106  CO2 22 15*  GLUCOSE 349* 190*  BUN 16 24*  CREATININE 1.47* 1.51*  CALCIUM 8.5* 8.6*  MG  --  1.8  PHOS  --  3.2    Liver Function Tests:  Recent Labs Lab 08/10/2015 1543  AST 70*  ALT 64*  ALKPHOS 69  BILITOT 0.2*  PROT 6.9  ALBUMIN 4.1   No results for input(s): LIPASE, AMYLASE in the last 168 hours. No results for input(s): AMMONIA in the last 168 hours.  CBC:  Recent Labs Lab 07/31/2015 1543 07/29/2015 2057  WBC 7.2 22.8*  HGB 13.8 15.3  HCT 41.7 46.7  MCV 95.7 96.1  PLT 241 230    Cardiac Enzymes:  Recent Labs Lab 08/17/2015 1543 07/28/2015 1909 07/28/2015 2057 08/03/15 0530 08/03/15 1025  TROPONINI <0.03 1.38* 2.93* 5.14* 3.27*     BNP: Invalid input(s): POCBNP  CBG:  Recent Labs Lab 08/03/15 0104 08/03/15 0200 08/03/15 0305 08/03/15 0356 08/03/15 0729  GLUCAP 253* 223* 178* 177* 188*    Microbiology: Results for orders placed or performed during the hospital encounter of 08/15/2015  MRSA PCR Screening     Status: None   Collection Time: 07/25/2015  6:26 PM  Result Value Ref Range Status   MRSA by PCR NEGATIVE NEGATIVE Final    Comment:        The GeneXpert MRSA Assay (FDA approved for NASAL specimens only), is one component of a comprehensive MRSA colonization surveillance program. It is not intended to diagnose MRSA infection nor to guide or monitor treatment for MRSA infections.     Coagulation Studies:  Recent Labs  08/16/2015 1543  LABPROT 14.4  INR 1.10    Urinalysis:  Recent Labs Lab 08/21/2015 1543  COLORURINE YELLOW*  LABSPEC 1.017  PHURINE 5.0  GLUCOSEU >500*  HGBUR NEGATIVE  BILIRUBINUR NEGATIVE  KETONESUR NEGATIVE  PROTEINUR 100*  NITRITE NEGATIVE  LEUKOCYTESUR NEGATIVE    Lipid Panel:  No results found for: CHOL, TRIG, HDL, CHOLHDL, VLDL, LDLCALC  HgbA1C: No results found for: HGBA1C  Urine Drug  Screen:     Component Value Date/Time   LABOPIA NONE DETECTED 08/17/2015 1543   LABOPIA NONE DETECTED 01/23/2011 1442   COCAINSCRNUR NONE DETECTED 08/10/2015 1543   COCAINSCRNUR NONE DETECTED 01/23/2011 1442   LABBENZ NONE DETECTED 07/26/2015 1543   LABBENZ NONE DETECTED 01/23/2011 1442   AMPHETMU NONE DETECTED 07/28/2015 1543   AMPHETMU NONE DETECTED 01/23/2011 1442   THCU NONE DETECTED 08/01/2015 1543   THCU POSITIVE* 01/23/2011 1442   LABBARB NONE DETECTED 08/16/2015 1543   LABBARB NONE DETECTED 01/23/2011 1442    Alcohol Level:  Recent Labs Lab 07/27/2015 [place] <5    Other results: EKG: normal sinus rhythm at 86 bpm.  Imaging: Dg Abd 1 View  08/21/2015  CLINICAL DATA:  Orogastric tube placement EXAM: ABDOMEN - 1 VIEW COMPARISON:  None. FINDINGS:  Orogastric tube tip and side port are in the proximal stomach. Bowel gas pattern is unremarkable. No demonstrable bowel obstruction or free air is seen. Lung bases are clear. IMPRESSION: Orogastric tube tip and side port in proximal stomach. Bowel gas pattern unremarkable. Electronically Signed   By: Lowella Grip III M.D.   On: 08/16/2015 20:21   Ct Head Wo Contrast  08/21/2015  CLINICAL DATA:  Patient unresponsive. Patient status post code with asystole and CPR. EXAM: CT HEAD WITHOUT CONTRAST TECHNIQUE: Contiguous axial images were obtained from the base of the skull through the vertex without intravenous contrast. COMPARISON:  None. FINDINGS: There is loss of gray-white differentiation throughout the cerebral hemispheres bilaterally compatible with extensive edema. Effacement of the sulci with mass effect exerted on the lateral ventricles bilaterally. High attenuation near the base of brain likely secondary to mass effect, favored to represent pseudo-subarachnoid hemorrhage. Findings suggestive of early transforaminal herniation. Orbits are unremarkable. Mild mucosal thickening ethmoid air cells and frontal sinus. Mastoid air cells are unremarkable. Calvarium is intact. IMPRESSION: Diffuse low attenuation and loss of gray-white differentiation throughout the cerebral hemispheres bilaterally most compatible with edema from anoxic brain injury. Findings suggestive of early transforaminal herniation. Critical Value/emergent results were called by telephone at the time of interpretation on 08/12/2015 at 4:30 pm to Dr. Lavonia Drafts , who verbally acknowledged these results. Electronically Signed   By: Lovey Newcomer M.D.   On: 08/03/2015 16:32   Dg Chest Port 1 View  08/03/2015  CLINICAL DATA:  Respiratory failure EXAM: PORTABLE CHEST 1 VIEW COMPARISON:  08/09/2015 FINDINGS: Endotracheal tube is 4.1 cm above the carina. The nasogastric tube extends into the stomach and beyond the inferior edge of the image.  Hazy opacity in the central and basilar lung regions persists, probably unchanged from the previous day. No large effusion. IMPRESSION: Support equipment appears satisfactorily positioned. No significant interval change in the bilateral airspace opacities. Electronically Signed   By: Andreas Newport M.D.   On: 08/03/2015 06:31   Dg Chest Portable 1 View  08/19/2015  CLINICAL DATA:  Unresponsive, CPR EXAM: PORTABLE CHEST 1 VIEW COMPARISON:  08/01/2015 FINDINGS: Endotracheal tip 2.2 cm above the carina. Cardiac silhouette obscured by spinal rods as well as medical devices Hazy density over both lungs bilaterally no pleural effusion. IMPRESSION: Endotracheal tube as described. Hazy bilateral parenchymal opacity possibly representing pulmonary edema. Electronically Signed   By: Skipper Cliche M.D.   On: 08/22/2015 17:26   Dg Chest Portable 1 View  08/09/2015  CLINICAL DATA:  Post intubation post CPR, found unresponsive EXAM: PORTABLE CHEST 1 VIEW COMPARISON:  None. FINDINGS: Borderline cardiomegaly. Metallic fixation rods are noted thoracolumbar spine.  There is endotracheal tube in place with tip 3.2 cm above the carina. No pneumothorax. No infiltrate or pulmonary edema. Mild dextroscoliosis thoracolumbar spine. IMPRESSION: Borderline cardiomegaly. No gross infiltrate or pulmonary edema. Endotracheal tube in place. No pneumothorax. Electronically Signed   By: Lahoma Crocker M.D.   On: 07/28/2015 16:05     Assessment/Plan: 40 year old male s/p arrest, now being cooled and sedated.  Patient is unresponsive.  Head CT reviewed and shows evidence of edema likely a result of anoxic brain injury.  Due to these findings being noted early in the clinical course, likelihood of improvement to a functional state is poor.  This was explained to family.  Once cooled if no improvement in examination would repeat head CT.  Will likely need to address withdrawal of care at that time.    Alexis Goodell,  MD Neurology [phone] 08/03/2015, 11:47 AM

## 2015-08-03 NOTE — Progress Notes (Addendum)
Updated family and answered questions, wife unable to make decision at this time on DNR, continues to be very tangential and to have questions regarding the events of his arrest, I tried to redirect her to the current situation, but she is unable. Pt remains full code for now.  Wells Guiles-Deep Natasia Sanko, M.D.  08/03/2015

## 2015-08-03 NOTE — Progress Notes (Signed)
Inpatient Diabetes Program Recommendations  AACE/ADA: New Consensus Statement on Inpatient Glycemic Control (2015)  Target Ranges:  Prepandial:   less than 140 mg/dL      Peak postprandial:   less than 180 mg/dL (1-2 hours)      Critically ill patients:  140 - 180 mg/dL   Review of Glycemic Control  Results for Dakota Smith, Muath R (MRN [id number]) as of 08/03/2015 09:16  Ref. Range 2015-10-29 22:33 08/03/2015 00:07 08/03/2015 01:04 08/03/2015 02:00 08/03/2015 03:05 08/03/2015 03:56 08/03/2015 07:29  Glucose-Capillary Latest Ref Range: 65-99 mg/dL 782245 (H) 956252 (H) 213253 (H) 223 (H) 178 (H) 177 (H) 188 (H)    Diabetes history: none note Outpatient Diabetes medications: none Current orders for Inpatient glycemic control: Novolog 0-15 units q4h  Inpatient Diabetes Program Recommendations: Agree with current orders for blood sugar management  Susette RacerJulie Cher Franzoni, RN, [place], [place]HA, CDE Diabetes Coordinator Inpatient Diabetes Program  [phone] (Team Pager) [phone] [facility] Office) 08/03/2015 9:17 AM

## 2015-08-03 NOTE — Plan of Care (Signed)
Problem: Health Behavior/Discharge Planning: Goal: Ability to manage health-related needs will improve Outcome: Not Progressing Patient currently on amio drip, fentanyl and propofol. Patient is also still code ice being cooled to a temperature of 36 degrees celsius. Patient is completely unresponsive and has a GCS of 3. [facility] has been contacted. Patient is not exhibiting any ability to progress off of drips. Will continue to monitor.

## 2015-08-03 NOTE — Progress Notes (Signed)
Pharmacy Antibiotic Note  Dakota Smith is a 40 y.o. male admitted on 08/11/2015 s/p PEA and receiving antibiotics for a  UTI.  Pharmacy has been consulted for ceftriaxone dosing.  Plan: Ceftriaxone 1 g IV daily for UTI dosing per discussion with MD  Height: 5\' 11"  (180.3 cm) Weight: 189 lb 9.5 oz (86 kg) IBW/kg (Calculated) : 75.3  Temp (24hrs), Avg:97.6 F (36.4 C), Min:94.1 F (34.5 C), Max:100.9 F (38.3 C)   Recent Labs Lab 08/07/2015 1543 08/19/2015 1909 08/01/2015 2057 08/03/15 0530  WBC 7.2  --  22.8*  --   CREATININE 1.47*  --   --  1.51*  LATICACIDVEN 7.3* 3.4*  --   --     Estimated Creatinine Clearance: 71.3 mL/min (by C-G formula based on Cr of 1.51).    Allergies  Allergen Reactions  . Ibuprofen Other (See Comments)    Gi bleed  . Tylenol [Acetaminophen] Other (See Comments)    Vomits blood  . Penicillins Rash    Antimicrobials this admission: ceftriaxone 5/11 >>   Dose adjustments this admission: 5/12 Changed from CTX 2 g to 1 g daily  Microbiology results: BCx: pending UCx: pending MRSA PCR: negative  Thank you for allowing pharmacy to be a part of this patient's care.  Cindi CarbonMary M Shaney Deckman, PharmD 08/03/2015 2:29 PM

## 2015-08-03 NOTE — Plan of Care (Signed)
Problem: Neurologic: Goal: Promote progressive neurologic recovery Outcome: Not Progressing Patient still unresponsive. GCS of 3.

## 2015-08-03 NOTE — Progress Notes (Signed)
RN paged Chaplain to accompany family at bedside. Chaplain at bedside now.  Horton ChinMacKayla A Shonnie Poudrier RN 12:29 PM

## 2015-08-03 NOTE — Progress Notes (Signed)
Initial Nutrition Assessment     INTERVENTION:  -Once pt is rewarmed and if aggressive nutrition therapy is wanted recommend vital 1.2 at goal rate of 9160ml/hr (with dirpivan at rate of 18.881ml/hr). Will provide 1728 kcals, 108 g of protein and 1166 ml free water.     NUTRITION DIAGNOSIS:   Inadequate oral intake related to acute illness as evidenced by NPO status.    GOAL:   Provide needs based on ASPEN/SCCM guidelines    MONITOR:   Vent status, Labs, I & O's  REASON FOR ASSESSMENT:   Ventilator    ASSESSMENT:   [age] y/o male admitted with PEA arrest, intubated on hypothermic protocol.  Planning to be rewarmed at 2100 on 08/03/15.  Neurology following.  Past Medical History  Diagnosis Date  . Anxiety    Medications reviewed: aspart, keppra, amiodarone, fentanyl,  Diprivan 18.1 ml/hr (provides 478 kcals/d) Labs reviewed: BUN 24, creatinine, 1.51, calcium 8.6, glucose 190, phosphorus and Mag WDL  OG tube noted in proximal stomach  Temp (24hrs), Avg:97.7 F (36.5 C), Min:94.1 F (34.5 C), Max:100.9 F (38.3 C)   Unable to complete Nutrition-Focused physical exam at this time.    Diet Order:  Diet NPO time specified  Skin:  Reviewed, no issues  Last BM:  PTA  Height:   Ht Readings from Last 1 Encounters:  07/31/2015 5\' 11"  (1.803 m)    Weight:   Wt Readings from Last 1 Encounters:  08/03/2015 189 lb 9.5 oz (86 kg)    Ideal Body Weight:     BMI:  Body mass index is 26.45 kg/(m^2).  Estimated Nutritional Needs:   Kcal:  2231 kcals/d (Ve 17.7, Tmax 37)  Protein:  (1.2-2.0 g/kg) 103-172 g/d  Fluid:  per MD  EDUCATION NEEDS:   No education needs identified at this time  Wandalee Klang B. Freida BusmanAllen, RD, LDN [phone] (pager) Weekend/On-Call pager [phone])

## 2015-08-03 NOTE — Progress Notes (Signed)
Patient remains unresponsive, but sedated while on artic sun device. NSR per cardiac monitor.  Tolerating ventilator.  300 cc UOP thus far during shift.  Report given to Adron BeneSarah Moore, RN who is now taking over care in addition to SchuylerMackayla, [place]. Family has been at bedside throughout shift and updated on patient's status and care by Dr. Thad Rangereynolds and Dr. Nicholos Johnsamachandran.

## 2015-08-03 NOTE — Progress Notes (Signed)
Received report from Adron BeneSarah Moore, RN and academy nurse Jaquita RectorMackayla, RN. Pt. Currently being cooled to 36 C w/ arctic sun.  Fentanyl, propofol and amio gtt's running.  [facility] and has an OGT to ILWS.  Will continue to monitor pt. Closely.

## 2015-08-03 NOTE — Progress Notes (Signed)
Patient having twitching like movements in arms and legs. E link doc notified of twitching. Verbal order to administer IV ativan in order to decrease twitching. Ativan was effective in the legs but patient still exhibits twitching in hands. Will continue to monitor.

## 2015-08-03 NOTE — Progress Notes (Signed)
eLink Physician-Brief Progress Note Patient Name: Dakota GlossJeffery R Smith DOB: 31-Oct-1975 MRN: [id number]   Date of Service  08/03/2015  HPI/Events of Note  Hyperglycemia  eICU Interventions  Placed on insulin gtt     Intervention Category Intermediate Interventions: Hyperglycemia - evaluation and treatment  DETERDING,ELIZABETH 08/03/2015, 12:41 AM

## 2015-08-03 NOTE — Progress Notes (Signed)
fio2 to 50% ...sats 100%

## 2015-08-04 ENCOUNTER — Encounter: Payer: Self-pay | Admitting: Certified Registered Nurse Anesthetist

## 2015-08-04 ENCOUNTER — Inpatient Hospital Stay: Payer: Self-pay

## 2015-08-04 DIAGNOSIS — I469 Cardiac arrest, cause unspecified: Principal | ICD-10-CM

## 2015-08-04 DIAGNOSIS — G931 Anoxic brain damage, not elsewhere classified: Secondary | ICD-10-CM

## 2015-08-04 LAB — CBC WITH DIFFERENTIAL/PLATELET
Basophils Absolute: 0 10*3/uL (ref 0–0.1)
Basophils Relative: 0 %
Eosinophils Absolute: 0 10*3/uL (ref 0–0.7)
Eosinophils Relative: 0 %
HEMATOCRIT: 37.6 % — AB (ref 40.0–52.0)
HEMOGLOBIN: 12.7 g/dL — AB (ref 13.0–18.0)
LYMPHS ABS: 3.1 10*3/uL (ref 1.0–3.6)
MCH: 31.3 pg (ref 26.0–34.0)
MCHC: 33.8 g/dL (ref 32.0–36.0)
MCV: 92.6 fL (ref 80.0–100.0)
Monocytes Absolute: 0.8 10*3/uL (ref 0.2–1.0)
NEUTROS ABS: 11.7 10*3/uL — AB (ref 1.4–6.5)
Platelets: 176 10*3/uL (ref 150–440)
RBC: 4.06 MIL/uL — AB (ref 4.40–5.90)
RDW: 14.2 % (ref 11.5–14.5)
WBC: 15.6 10*3/uL — AB (ref 3.8–10.6)

## 2015-08-04 LAB — URINALYSIS COMPLETE WITH MICROSCOPIC (ARMC ONLY)
BACTERIA UA: NONE SEEN
BILIRUBIN URINE: NEGATIVE
GLUCOSE, UA: NEGATIVE mg/dL
Hgb urine dipstick: NEGATIVE
Ketones, ur: NEGATIVE mg/dL
Leukocytes, UA: NEGATIVE
Nitrite: NEGATIVE
PH: 5 (ref 5.0–8.0)
Protein, ur: NEGATIVE mg/dL
SQUAMOUS EPITHELIAL / LPF: NONE SEEN
Specific Gravity, Urine: 1.002 — ABNORMAL LOW (ref 1.005–1.030)

## 2015-08-04 LAB — TYPE AND SCREEN
ABO/RH(D): A POS
ANTIBODY SCREEN: NEGATIVE

## 2015-08-04 LAB — COMPREHENSIVE METABOLIC PANEL WITH GFR
ALT: 46 U/L (ref 17–63)
AST: 60 U/L — ABNORMAL HIGH (ref 15–41)
Albumin: 3.1 g/dL — ABNORMAL LOW (ref 3.5–5.0)
Alkaline Phosphatase: 60 U/L (ref 38–126)
Anion gap: 4 — ABNORMAL LOW (ref 5–15)
BUN: 22 mg/dL — ABNORMAL HIGH (ref 6–20)
CO2: 22 mmol/L (ref 22–32)
Calcium: 8.4 mg/dL — ABNORMAL LOW (ref 8.9–10.3)
Chloride: 114 mmol/L — ABNORMAL HIGH (ref 101–111)
Creatinine, Ser: 1.48 mg/dL — ABNORMAL HIGH (ref 0.61–1.24)
GFR calc Af Amer: >60 mL/min
GFR calc non Af Amer: 59 mL/min — ABNORMAL LOW
Glucose, Bld: 149 mg/dL — ABNORMAL HIGH (ref 65–99)
Potassium: 3.7 mmol/L (ref 3.5–5.1)
Sodium: 140 mmol/L (ref 135–145)
Total Bilirubin: 0.5 mg/dL (ref 0.3–1.2)
Total Protein: 5.6 g/dL — ABNORMAL LOW (ref 6.5–8.1)

## 2015-08-04 LAB — BLOOD GAS, ARTERIAL
ACID-BASE DEFICIT: 1.3 mmol/L (ref 0.0–2.0)
Allens test (pass/fail): POSITIVE — AB
Bicarbonate: 18.3 mEq/L — ABNORMAL LOW (ref 21.0–28.0)
FIO2: 0.3
LHR: 30 {breaths}/min
O2 Saturation: 96.7 %
PEEP: 5 cmH2O
Patient temperature: 37
VT: 580 mL
pCO2 arterial: 20 mmHg — CL (ref 32.0–48.0)
pH, Arterial: 7.57 — ABNORMAL HIGH (ref 7.350–7.450)
pO2, Arterial: 74 mmHg — ABNORMAL LOW (ref 83.0–108.0)

## 2015-08-04 LAB — EXPECTORATED SPUTUM ASSESSMENT W GRAM STAIN, RFLX TO RESP C

## 2015-08-04 LAB — CBC
HCT: 39.8 % — ABNORMAL LOW (ref 40.0–52.0)
Hemoglobin: 13.3 g/dL (ref 13.0–18.0)
MCH: 31.4 pg (ref 26.0–34.0)
MCHC: 33.5 g/dL (ref 32.0–36.0)
MCV: 93.5 fL (ref 80.0–100.0)
Platelets: 207 K/uL (ref 150–440)
RBC: 4.25 MIL/uL — ABNORMAL LOW (ref 4.40–5.90)
RDW: 13.9 % (ref 11.5–14.5)
WBC: 19.3 K/uL — ABNORMAL HIGH (ref 3.8–10.6)

## 2015-08-04 LAB — URINE CULTURE: Culture: NO GROWTH

## 2015-08-04 LAB — BASIC METABOLIC PANEL
Anion gap: 8 (ref 5–15)
BUN: 20 mg/dL (ref 6–20)
CALCIUM: 8.3 mg/dL — AB (ref 8.9–10.3)
CHLORIDE: 119 mmol/L — AB (ref 101–111)
CO2: 18 mmol/L — AB (ref 22–32)
CREATININE: 1.63 mg/dL — AB (ref 0.61–1.24)
GFR calc non Af Amer: 52 mL/min — ABNORMAL LOW (ref >60)
Glucose, Bld: 133 mg/dL — ABNORMAL HIGH (ref 65–99)
Potassium: 3.5 mmol/L (ref 3.5–5.1)
SODIUM: 145 mmol/L (ref 135–145)

## 2015-08-04 LAB — EXPECTORATED SPUTUM ASSESSMENT W REFEX TO RESP CULTURE

## 2015-08-04 LAB — ABO/RH: ABO/RH(D): A POS

## 2015-08-04 LAB — GLUCOSE, CAPILLARY
GLUCOSE-CAPILLARY: 132 mg/dL — AB (ref 65–99)
GLUCOSE-CAPILLARY: 159 mg/dL — AB (ref 65–99)
Glucose-Capillary: 135 mg/dL — ABNORMAL HIGH (ref 65–99)
Glucose-Capillary: 143 mg/dL — ABNORMAL HIGH (ref 65–99)
Glucose-Capillary: 146 mg/dL — ABNORMAL HIGH (ref 65–99)

## 2015-08-04 MED ORDER — MORPHINE SULFATE (PF) 2 MG/ML IV SOLN
INTRAVENOUS | Status: AC
  Filled 2015-08-04: qty 1

## 2015-08-04 MED ORDER — SODIUM CHLORIDE 0.9 % IV SOLN: 250.0000 mL | INTRAVENOUS | Status: DC | PRN

## 2015-08-04 MED ORDER — SODIUM CHLORIDE 0.9 % IV SOLN
INTRAVENOUS | Status: DC
  Administered 2015-08-04: 16:00:00 via INTRAVENOUS

## 2015-08-04 MED ORDER — VANCOMYCIN HCL IN DEXTROSE 1-5 GM/200ML-% IV SOLN
1000.0000 mg | Freq: Once | INTRAVENOUS | Status: AC
  Administered 2015-08-04: 1000 mg via INTRAVENOUS
  Filled 2015-08-04: qty 200

## 2015-08-04 MED ORDER — VASOPRESSIN 20 UNIT/ML IV SOLN
0.0300 [IU]/min | INTRAVENOUS | Status: DC
  Administered 2015-08-04: 0.04 [IU]/min via INTRAVENOUS
  Filled 2015-08-04: qty 2

## 2015-08-04 NOTE — Consult Note (Signed)
CC: Unresponsive  CTH reviewed which is consistent with severe anoxic brain injury.   Past Medical History  Diagnosis Date  . Anxiety     Past Surgical History  Procedure Laterality Date  . Hand surgery    . Back surgery    . Hip surgery Bilateral     No family history on file.  Social History:  reports that he has been smoking.  He does not have any smokeless tobacco history on file. He reports that he does not drink alcohol. His drug history is not on file.  Allergies  Allergen Reactions  . Ibuprofen Other (See Comments)    Gi bleed  . Tylenol [Acetaminophen] Other (See Comments)    Vomits blood  . Penicillins Rash    Medications:  I have reviewed the patient's current medications. Prior to Admission:  Prescriptions prior to admission  Medication Sig Dispense Refill Last Dose  . aspirin 81 MG tablet Take 81 mg by mouth daily.   Past Week at Unknown time  . oxyCODONE (ROXICODONE) 5 MG immediate release tablet Take 1 tablet (5 mg total) by mouth every 8 (eight) hours as needed. 12 tablet 0 Past Month at Unknown time   Scheduled: . antiseptic oral rinse  7 mL Mouth Rinse 10 times per day  . artificial tears  1 application Both Eyes P3I  . atropine      . cefTRIAXone (ROCEPHIN)  IV  1 g Intravenous Q24H  . chlorhexidine gluconate (SAGE KIT)  15 mL Mouth Rinse BID  . heparin  5,000 Units Subcutaneous Q8H  . insulin aspart  0-15 Units Subcutaneous Q4H  . lacosamide (VIMPAT) IV  50 mg Intravenous Q12H  . levETIRAcetam  1,000 mg Intravenous Q12H  . pantoprazole (PROTONIX) IV  40 mg Intravenous QHS    ROS: History obtained from wife  General ROS: negative for - chills, fatigue, fever, night sweats, weight gain or weight loss Psychological ROS: negative for - behavioral disorder, hallucinations, memory difficulties, mood swings or suicidal ideation Ophthalmic ROS: negative for - blurry vision, double vision, eye pain or loss of vision ENT ROS: negative for -  epistaxis, nasal discharge, oral lesions, sore throat, tinnitus or vertigo Allergy and Immunology ROS: negative for - hives or itchy/watery eyes Hematological and Lymphatic ROS: negative for - bleeding problems, bruising or swollen lymph nodes Endocrine ROS: negative for - galactorrhea, hair pattern changes, polydipsia/polyuria or temperature intolerance Respiratory ROS: negative for - cough, hemoptysis, shortness of breath or wheezing Cardiovascular ROS: negative for - chest pain, dyspnea on exertion, edema or irregular heartbeat Gastrointestinal ROS: negative for - abdominal pain, diarrhea, hematemesis, nausea/vomiting or stool incontinence Genito-Urinary ROS: negative for - dysuria, hematuria, incontinence or urinary frequency/urgency Musculoskeletal ROS: wrist pain due to fracture Neurological ROS: as noted in HPI Dermatological ROS: negative for rash and skin lesion changes  Physical Examination: Blood pressure 99/69, pulse 90, temperature 98.2 F (36.8 C), temperature source Other (Comment), resp. rate 30, height '5\' 11"'$  (1.803 m), weight 191 lb 5.8 oz (86.8 kg), SpO2 97 %.  HEENT-  Normocephalic, no lesions, without obvious abnormality.  Normal external eye and conjunctiva.  Normal TM's bilaterally.  Normal auditory canals and external ears. Normal external nose, mucus membranes and septum.  Normal pharynx. Cardiovascular- S1, S2 normal, pulses palpable throughout   Lungs- chest clear, no wheezing, rales, normal symmetric air entry Abdomen- soft, non-tender; bowel sounds normal; no masses,  no organomegaly Extremities- no edema Lymph-no adenopathy palpable Musculoskeletal-no joint tenderness, deformity or  swelling Skin-warm and dry, no hyperpigmentation, vitiligo, or suspicious lesions  Neurological Examination Mental Status: Patient does not respond to verbal stimuli.  Does not respond to deep sternal rub.  Does not follow commands.  No verbalizations noted.  Cranial Nerves: II:  patient does not respond confrontation bilaterally, pupils right 2 mm, left 2 mm,and unreactive bilaterally III,IV,VI: doll's response absent bilaterally.  V,VII: corneal reflex absent bilaterally  VIII: patient does not respond to verbal stimuli IX,X: gag reflex reduced, XI: trapezius strength unable to test bilaterally XII: tongue strength unable to test Motor: Extremities flaccid throughout.  No spontaneous movement noted.  No purposeful movements noted. Sensory: Does not respond to noxious stimuli in any extremity. Deep Tendon Reflexes:  1+ in the upper extremities and absent in the lower extremities Plantars: mute bilaterally Cerebellar: Unable to perform      Laboratory Studies:   Basic Metabolic Panel:  Recent Labs Lab 08/11/2015 1543 08/03/15 0530 08/03/15 1717 08/03/2015 0455  NA 136 136 134* 140  K 3.3* 3.8 3.8 3.7  CL 102 106 106 114*  CO2 22 15* 16* 22  GLUCOSE 349* 190* 153* 149*  BUN 16 24* 24* 22*  CREATININE 1.47* 1.51* 1.35* 1.48*  CALCIUM 8.5* 8.6* 8.8* 8.4*  MG  --  1.8  --   --   PHOS  --  3.2  --   --     Liver Function Tests:  Recent Labs Lab 07/28/2015 1543 08/13/2015 0455  AST 70* 60*  ALT 64* 46  ALKPHOS 69 60  BILITOT 0.2* 0.5  PROT 6.9 5.6*  ALBUMIN 4.1 3.1*   No results for input(s): LIPASE, AMYLASE in the last 168 hours. No results for input(s): AMMONIA in the last 168 hours.  CBC:  Recent Labs Lab 07/24/2015 1543 08/22/2015 2057 08/17/2015 0455  WBC 7.2 22.8* 19.3*  HGB 13.8 15.3 13.3  HCT 41.7 46.7 39.8*  MCV 95.7 96.1 93.5  PLT 241 230 207    Cardiac Enzymes:  Recent Labs Lab 08/10/2015 1543 08/21/2015 1909 08/01/2015 2057 08/03/15 0530 08/03/15 1025  TROPONINI <0.03 1.38* 2.93* 5.14* 3.27*    BNP: Invalid input(s): POCBNP  CBG:  Recent Labs Lab 08/03/15 1631 08/03/15 1958 08/14/2015 0007 08/11/2015 0427 07/27/2015 0808  GLUCAP 136* 126* 146* 143* 132*    Microbiology: Results for orders placed or performed  during the hospital encounter of 07/30/2015  Blood culture (routine x 2)     Status: None (Preliminary result)   Collection Time: 07/29/2015  3:43 PM  Result Value Ref Range Status   Specimen Description BLOOD START OF PT LINE  Final   Special Requests BOTTLES DRAWN AEROBIC AND ANAEROBIC 6CC  Final   Culture NO GROWTH 2 DAYS  Final   Report Status PENDING  Incomplete  MRSA PCR Screening     Status: None   Collection Time: 08/11/2015  6:26 PM  Result Value Ref Range Status   MRSA by PCR NEGATIVE NEGATIVE Final    Comment:        The GeneXpert MRSA Assay (FDA approved for NASAL specimens only), is one component of a comprehensive MRSA colonization surveillance program. It is not intended to diagnose MRSA infection nor to guide or monitor treatment for MRSA infections.   Blood culture (routine x 2)     Status: None (Preliminary result)   Collection Time: 08/07/2015  8:46 PM  Result Value Ref Range Status   Specimen Description BLOOD LEFT HAND  Final   Special Requests BOTTLES DRAWN  AEROBIC AND ANAEROBIC  Final   Culture NO GROWTH 2 DAYS  Final   Report Status PENDING  Incomplete  Urine culture     Status: None   Collection Time: 07/31/2015 11:43 PM  Result Value Ref Range Status   Specimen Description URINE, RANDOM  Final   Special Requests NONE  Final   Culture NO GROWTH 1 DAY  Final   Report Status 08/03/2015 FINAL  Final    Coagulation Studies:  Recent Labs  08/10/2015 1543 08/03/15 1334  LABPROT 14.4 15.2*  INR 1.10 1.18    Urinalysis:   Recent Labs Lab 08/19/2015 1543  COLORURINE YELLOW*  LABSPEC 1.017  PHURINE 5.0  GLUCOSEU >500*  HGBUR NEGATIVE  BILIRUBINUR NEGATIVE  KETONESUR NEGATIVE  PROTEINUR 100*  NITRITE NEGATIVE  LEUKOCYTESUR NEGATIVE    Lipid Panel:  No results found for: CHOL, TRIG, HDL, CHOLHDL, VLDL, LDLCALC  HgbA1C: No results found for: HGBA1C  Urine Drug Screen:      Component Value Date/Time   LABOPIA NONE DETECTED 07/24/2015  1543   LABOPIA NONE DETECTED 01/23/2011 1442   COCAINSCRNUR NONE DETECTED 08/22/2015 1543   COCAINSCRNUR NONE DETECTED 01/23/2011 1442   LABBENZ NONE DETECTED 08/06/2015 1543   LABBENZ NONE DETECTED 01/23/2011 1442   AMPHETMU NONE DETECTED 07/29/2015 1543   AMPHETMU NONE DETECTED 01/23/2011 1442   THCU NONE DETECTED 08/22/2015 1543   THCU POSITIVE* 01/23/2011 1442   LABBARB NONE DETECTED 07/24/2015 1543   LABBARB NONE DETECTED 01/23/2011 1442    Alcohol Level:   Recent Labs Lab 08/07/2015 1543  ETH <5    Other results: EKG: normal sinus rhythm at 86 bpm.  Imaging: Dg Abd 1 View  07/29/2015  CLINICAL DATA:  Orogastric tube placement EXAM: ABDOMEN - 1 VIEW COMPARISON:  None. FINDINGS: Orogastric tube tip and side port are in the proximal stomach. Bowel gas pattern is unremarkable. No demonstrable bowel obstruction or free air is seen. Lung bases are clear. IMPRESSION: Orogastric tube tip and side port in proximal stomach. Bowel gas pattern unremarkable. Electronically Signed   By: Bretta Bang III M.D.   On: 08/01/2015 20:21   Ct Head Wo Contrast  08/22/2015  CLINICAL DATA:  Patient found unresponsive on Aug 02, 2015 EXAM: CT HEAD WITHOUT CONTRAST TECHNIQUE: Contiguous axial images were obtained from the base of the skull through the vertex without intravenous contrast. COMPARISON:  Aug 02, 2015 FINDINGS: The paranasal sinuses are well aerated. There is a small amount of fluid in the inferior left mastoid air cells. The mastoid air cells are otherwise well aerated as are the middle ears. No fractures. Extracranial soft tissues are unremarkable. No subdural, epidural, or subarachnoid hemorrhage is identified. There is complete loss of gray-white differentiation, progressed in the interval, consistent with diffuse anoxic injury. There is low attenuation in the caudate heads and basal ganglia bilaterally, new in the interval, consistent with infarcts. Another small similar region is seen  in the external capsule on the right. The ventricles are similar in the interval. There is partial compression of the frontal horns consistent with cerebral edema. The quadrigeminal plate and suprasellar cisterns are completely effaced which is a new finding. There is no midline shift or mass identified. The cerebellum and brainstem are stable. No other interval changes. IMPRESSION: 1. Diffuse cerebral edema and developing infarcts consistent with the history of anoxic injury. The findings have progressed since the comparison CT scan. There is now complete effacement of the basilar cisterns consistent with diffuse and severe  cerebral edema. If the patient has not already begun to herniate, he is certainly at very high risk. Findings called to the ICU physician, Dr. Juanell Fairly Electronically Signed   By: Dorise Bullion III M.D   On: 08/22/2015 09:57   Ct Head Wo Contrast  08/07/2015  CLINICAL DATA:  Patient unresponsive. Patient status post code with asystole and CPR. EXAM: CT HEAD WITHOUT CONTRAST TECHNIQUE: Contiguous axial images were obtained from the base of the skull through the vertex without intravenous contrast. COMPARISON:  None. FINDINGS: There is loss of gray-white differentiation throughout the cerebral hemispheres bilaterally compatible with extensive edema. Effacement of the sulci with mass effect exerted on the lateral ventricles bilaterally. High attenuation near the base of brain likely secondary to mass effect, favored to represent pseudo-subarachnoid hemorrhage. Findings suggestive of early transforaminal herniation. Orbits are unremarkable. Mild mucosal thickening ethmoid air cells and frontal sinus. Mastoid air cells are unremarkable. Calvarium is intact. IMPRESSION: Diffuse low attenuation and loss of gray-white differentiation throughout the cerebral hemispheres bilaterally most compatible with edema from anoxic brain injury. Findings suggestive of early transforaminal herniation. Critical  Value/emergent results were called by telephone at the time of interpretation on 08/12/2015 at 4:30 pm to Dr. Lavonia Drafts , who verbally acknowledged these results. Electronically Signed   By: Lovey Newcomer M.D.   On: 08/18/2015 16:32   Dg Chest Port 1 View  08/03/2015  CLINICAL DATA:  Respiratory failure EXAM: PORTABLE CHEST 1 VIEW COMPARISON:  07/31/2015 FINDINGS: Endotracheal tube is 4.1 cm above the carina. The nasogastric tube extends into the stomach and beyond the inferior edge of the image. Hazy opacity in the central and basilar lung regions persists, probably unchanged from the previous day. No large effusion. IMPRESSION: Support equipment appears satisfactorily positioned. No significant interval change in the bilateral airspace opacities. Electronically Signed   By: Andreas Newport M.D.   On: 08/03/2015 06:31   Dg Chest Portable 1 View  07/23/2015  CLINICAL DATA:  Unresponsive, CPR EXAM: PORTABLE CHEST 1 VIEW COMPARISON:  07/25/2015 FINDINGS: Endotracheal tip 2.2 cm above the carina. Cardiac silhouette obscured by spinal rods as well as medical devices Hazy density over both lungs bilaterally no pleural effusion. IMPRESSION: Endotracheal tube as described. Hazy bilateral parenchymal opacity possibly representing pulmonary edema. Electronically Signed   By: Skipper Cliche M.D.   On: 08/07/2015 17:26   Dg Chest Portable 1 View  08/14/2015  CLINICAL DATA:  Post intubation post CPR, found unresponsive EXAM: PORTABLE CHEST 1 VIEW COMPARISON:  None. FINDINGS: Borderline cardiomegaly. Metallic fixation rods are noted thoracolumbar spine. There is endotracheal tube in place with tip 3.2 cm above the carina. No pneumothorax. No infiltrate or pulmonary edema. Mild dextroscoliosis thoracolumbar spine. IMPRESSION: Borderline cardiomegaly. No gross infiltrate or pulmonary edema. Endotracheal tube in place. No pneumothorax. Electronically Signed   By: Lahoma Crocker M.D.   On: 08/13/2015 16:05      Assessment/Plan: 40 year old male s/p arrest, now being cooled and sedated.  Patient is unresponsive.    Pt has been warmed poor exam. CTH severe anoxic injury. S/p discussion with family over phone (wife and mother) very poor prognosis explained and likely hood of progression to brain death. Received fentanyl last night so will not be able to brain death testing for 48 hours.  - Family agreed on  DNR which is placed - I highly suspect that they will withdraw care in the next 24-48 hrs.    Leotis Pain

## 2015-08-04 NOTE — Progress Notes (Signed)
Family has made decision to withdraw care.  Dr Ardyth Manam here and aware.  CDS Judeth CornfieldStephanie in to speak with family about end of life options.

## 2015-08-04 NOTE — Progress Notes (Signed)
[facility] Critical Care Medicine Progess Note    ASSESSMENT/PLAN    Summary: 40 year old male with no significant prior medical history was PEA arrested, now intubated and mechanically ventilated. Completed hypothermia protocol post cardiac arrest. Now with anoxic encephalopathy,Worsening cerebral edema and early signs herniation.   ASSESSMENT / PLAN:  PULMONARY A: Acute hypoxemic/hypercarbic respiratory failure related to PEA arrest-Hypoxemia is now improved on the ventilator. History of tobacco abuse Chest x-ray images 5/12 reviewed: Adequately positioned life-support devices, there is mild right-sided infiltrate, which may be consistent with aspiration versus reperfusion edema. Patient was taken off the vent for approximately 1 minute. There was no spontaneous respirations noted. P:  Vent. Full support . Current vent settings PRVC/30/580/12/60%; we will wean down FiO2. Recheck ABG  -Continue full vent support, the patient is not yet a candidate for consideration of weaning.  CARDIOVASCULAR A:  Status post cardiac arrest, with sinus tachycardia. Elevated troponin at 2.93>> 5.14, likely secondary to cardiac arrest P:  Cardiology following, patient is currently on an amiodarone infusion, will continue.  RENAL A:  Acute kidney injury. Creatinine 1.47>> 1.51 today Metabolic acidosis, secondary to cardiac arrest. UTI  P:  rocephin Follow chemistry Continue IV fluids.  GASTROINTESTINAL A:  No active issue Elevated liver enzymes r/t alcohol abuse and/or acute hepatopathy secondary to cardiac arrest\shock P:  Follow ALT,AST protonix for GI prophylaxis  HEMATOLOGIC A:  Leukocytosis, which may be reactive. Hypokalemia P:  Replace electrolytes per icu protocol Transfuse if HgB<7  INFECTIOUS A:  UTI Lactic acidosis, likely secondary to cardiac arrest. P:  Rocephin Follow CBC Trend lactic acid  Micro/culture results:  BCx2 5/11:  pending UC 5/11: Pending Sputum-- MRSA screen 5/11: Negative  Antibiotics: Ceftriaxone 5/11>>  ENDOCRINE A:  Hyperglycemia , now on insulin drip. P:  Continue insulin infusion per protocol.  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  Myoclonic seizures-- now controlled.  Reviewed CT images and discussed with radiologist, there is progression of the previously seen cerebral edema with near complete effacement of cerebral sulci. The patient is off sedation and is unresponsive. He has no gag reflex, negative oculocephalic reflex. No spontaneous respirations on ventilator P:  The patient may be brain dead. Per neurology. The patient needs to wait approximate 48 hours before brain death protocol can be initiated due to hypothermia, sedatives.   DVT Prophylaxis: Heparin GI Prophylaxis: Protonix   ---------------------------------------   ----------------------------------------   Name: Renae GlossJeffery R Spikes MRN: [id number] DOB: 06-Dec-1975    ADMISSION DATE:  08/07/2015   SUBJECTIVE:   Pt currently on the ventilator, can not provide history or review of systems.     VITAL SIGNS: Temp:  [96.4 F (35.8 C)-98.6 F (37 C)] 98.4 F (36.9 C) (05/13 1700) Pulse Rate:  [40-98] 89 (05/13 1715) Resp:  [22-30] 30 (05/13 1715) BP: (45-132)/(29-105) 124/92 mmHg (05/13 1715) SpO2:  [95 %-100 %] 98 % (05/13 1715) FiO2 (%):  [30 %] 30 % (05/13 1700) Weight:  [191 lb 5.8 oz (86.8 kg)] 191 lb 5.8 oz (86.8 kg) (05/13 0446) HEMODYNAMICS: CVP:  [5 mmHg-28 mmHg] 5 mmHg VENTILATOR SETTINGS: Vent Mode:  [-] PRVC FiO2 (%):  [30 %] 30 % Set Rate:  [30 bmp] 30 bmp Vt Set:  [580 mL] 580 mL PEEP:  [5 cmH20-10 cmH20] 5 cmH20 Plateau Pressure:  [22 cmH20] 22 cmH20 INTAKE / OUTPUT:  Intake/Output Summary (Last 24 hours) at 05-28-2015 1913 Last data  filed at 08/17/2015 1700  Gross per 24 hour   Intake 5012.55 ml  Output   5025 ml  Net -12.45 ml    PHYSICAL EXAMINATION: Physical Examination:   VS: BP 124/92 mmHg  Pulse 89  Temp(Src) 98.4 F (36.9 C) (Other (Comment))  Resp 30  Ht 5\' 11"  (1.803 m)  Wt 191 lb 5.8 oz (86.8 kg)  BMI 26.70 kg/m2  SpO2 98%  General Appearance: No distress , Sedated Neuro:without focal findings, mental status , Unresponsive HEENT: PERRLA, EOM intact. ET tube in place, No gag, no cough response on deep suctioning. Pulmonary: normal breath sounds  , no spontaneous respirations. Upon disconnecting the patient from the ventilator for 1 minute. CardiovascularNormal S1,S2.  No m/r/g.   Abdomen: Benign, Soft, non-tender. Renal:  No costovertebral tenderness  GU:  Not performed at this time. Endocrine: No evident thyromegaly. Skin:   warm, no rashes, no ecchymosis  Extremities: normal, no cyanosis, clubbing.   LABS:   LABORATORY PANEL:   CBC  Recent Labs Lab 08/14/2015 1442  WBC 15.6*  HGB 12.7*  HCT 37.6*  PLT 176    Chemistries   Recent Labs Lab 08/03/15 0530  08/20/2015 0455 08/15/2015 1442  NA 136  < > 140 145  K 3.8  < > 3.7 3.5  CL 106  < > 114* 119*  CO2 15*  < > 22 18*  GLUCOSE 190*  < > 149* 133*  BUN 24*  < > 22* 20  CREATININE 1.51*  < > 1.48* 1.63*  CALCIUM 8.6*  < > 8.4* 8.3*  MG 1.8  --   --   --   PHOS 3.2  --   --   --   AST  --   --  60*  --   ALT  --   --  46  --   ALKPHOS  --   --  60  --   BILITOT  --   --  0.5  --   < > = values in this interval not displayed.   Recent Labs Lab 08/03/15 1958 07/23/2015 0007 08/18/2015 0427 07/25/2015 0808 07/30/2015 1214 08/19/2015 1732  GLUCAP 126* 146* 143* 132* 135* 159*    Recent Labs Lab 08/03/15 0300 08/03/15 1045 08/17/2015 1442  PHART 7.26* 7.38 7.57*  PCO2ART 28* 24* 20*  PO2ART 402* 167* 74*    Recent Labs Lab 08/17/2015 1543 08/11/2015 0455  AST 70* 60*  ALT 64* 46  ALKPHOS 69 60  BILITOT 0.2* 0.5  ALBUMIN 4.1 3.1*    Cardiac Enzymes  Recent  Labs Lab 08/03/15 1025  TROPONINI 3.27*    RADIOLOGY:  Dg Abd 1 View  07/24/2015  CLINICAL DATA:  Orogastric tube placement EXAM: ABDOMEN - 1 VIEW COMPARISON:  None. FINDINGS: Orogastric tube tip and side port are in the proximal stomach. Bowel gas pattern is unremarkable. No demonstrable bowel obstruction or free air is seen. Lung bases are clear. IMPRESSION: Orogastric tube tip and side port in proximal stomach. Bowel gas pattern unremarkable. Electronically Signed   By: Bretta Bang III M.D.   On: 08/20/2015 20:21   Ct Head Wo Contrast  07/23/2015  CLINICAL DATA:  Patient found unresponsive on Aug 05, 2015 EXAM: CT HEAD WITHOUT CONTRAST TECHNIQUE: Contiguous axial images were obtained from the base of the skull through the vertex without intravenous contrast. COMPARISON:  08/12/2015 FINDINGS: The paranasal sinuses are well aerated. There is a small amount of fluid in the inferior left  mastoid air cells. The mastoid air cells are otherwise well aerated as are the middle ears. No fractures. Extracranial soft tissues are unremarkable. No subdural, epidural, or subarachnoid hemorrhage is identified. There is complete loss of gray-white differentiation, progressed in the interval, consistent with diffuse anoxic injury. There is low attenuation in the caudate heads and basal ganglia bilaterally, new in the interval, consistent with infarcts. Another small similar region is seen in the external capsule on the right. The ventricles are similar in the interval. There is partial compression of the frontal horns consistent with cerebral edema. The quadrigeminal plate and suprasellar cisterns are completely effaced which is a new finding. There is no midline shift or mass identified. The cerebellum and brainstem are stable. No other interval changes. IMPRESSION: 1. Diffuse cerebral edema and developing infarcts consistent with the history of anoxic injury. The findings have progressed since the comparison  CT scan. There is now complete effacement of the basilar cisterns consistent with diffuse and severe cerebral edema. If the patient has not already begun to herniate, he is certainly at very high risk. Findings called to the ICU physician, Dr. Ardyth Man Electronically Signed   By: Gerome Sam III M.D   On: 08/13/2015 09:57   Dg Chest Port 1 View  08/03/2015  CLINICAL DATA:  Respiratory failure EXAM: PORTABLE CHEST 1 VIEW COMPARISON:  07/25/2015 FINDINGS: Endotracheal tube is 4.1 cm above the carina. The nasogastric tube extends into the stomach and beyond the inferior edge of the image. Hazy opacity in the central and basilar lung regions persists, probably unchanged from the previous day. No large effusion. IMPRESSION: Support equipment appears satisfactorily positioned. No significant interval change in the bilateral airspace opacities. Electronically Signed   By: Ellery Plunk M.D.   On: 08/03/2015 06:31       --Wells Guiles, MD.  ICU Pager: [phone] [facility] Office Number: [phone]  Santiago Glad, M.D.  Stephanie Acre, M.D.  Billy Fischer, M.D  08/20/2015   Critical Care Attestation.  I have personally obtained a history, examined the patient, evaluated laboratory and imaging results, formulated the assessment and plan and placed orders. The Patient requires high complexity decision making for assessment and support, frequent evaluation and titration of therapies, application of advanced monitoring technologies and extensive interpretation of multiple databases. The patient has critical illness that could lead imminently to failure of 1 or more organ systems and requires the highest level of physician preparedness to intervene.  Critical Care Time devoted to patient care services described in this note is 60 minutes and is exclusive of time spent in procedures.

## 2015-08-04 NOTE — Progress Notes (Signed)
Dr Loretha BrasilZeylikman ( neurology) has reviewed CT scan and spoke with Pt  wife and his mother.  Decision to make a DNR was made.

## 2015-08-04 NOTE — Progress Notes (Signed)
Spoke w/ Ms. Luci Bankukov, NP alerting her of the family's wish to w/d.  Orders were placed for extubation and RN may pronounce by Dr. Ardyth Manam. 2011Trey Paula: Jeff, RT extubated pt. And all pressors were discontinued. 2028: pt. Went into asystole, confirmed by central tele. This RN pronounced death with verification by Reynolds BowlBeth Buono, RN. Spouse and other family were present for withdrawal of care. Ann, nursing supervisor alerted and CDS called. Pt. Will be transported to morgue awaiting autopsy.

## 2015-08-04 NOTE — Progress Notes (Signed)
Received call from [facility] donor services, the patient is no longer able to donate organs, as the patient will be a medical examiner case. We'll therefore place orders for terminal wean.  Wells Guileseep Boss Danielsen, M.D.  April 30, 2015

## 2015-08-04 NOTE — Progress Notes (Signed)
Alerted Ms. Tukov, NP that pt.'s output is clear and jumped to 380 @ 0100, then 100 @ 0200, then 550 @ 0300. NP ordered 250 mL bolus as replacement, will continue to monitor closely.

## 2015-08-04 NOTE — Progress Notes (Signed)
Pt. Still having large outputs each hour.  Discussed w/ Ms. Luci Bankukov, NP who instructed to provide urine replacement if output is > 200 mL/h, replace half in NS.

## 2015-08-04 NOTE — Progress Notes (Signed)
PT. BP fell into high 70's-80's, consulted w/ Luci Bankukov, NP gave total of 500 mL NS bolus, SBP fell into 50's started levo gtt to meet the MAP goal of 70- as reconfirmed by Luci Bankukov, NP. Will continue to monitor pt. closely

## 2015-08-04 NOTE — Progress Notes (Signed)
Chaplain rounded the unit and provided a compassionate presence and spiritual to the family.  Jefm PettyChaplain Lucill Mauck [phone]

## 2015-08-04 NOTE — Progress Notes (Signed)
To CT scan

## 2015-08-04 NOTE — Progress Notes (Signed)
Began rewarming practice.  VSS.

## 2015-08-04 NOTE — Progress Notes (Signed)
Interim note:  On my eval this am pt is completely unresponsive. Will send for stat CT head to evaluate for possible herniation.  Nicholos Johns-Dionisia Pacholski, MD 08/10/2015

## 2015-08-04 NOTE — Progress Notes (Signed)
Pt wife and family assisted with grieving. Lots of family support.  Unfortunately, organ donation did not progress due to medical examiner case.  Wife allowed to lay beside her husband in bed.  Once other family arrives, they will decide time to withdraw care.  Dr Ardyth Manam aware. Remains on levophed and vasopressin gtts.  Report given to [facility] RN.

## 2015-08-04 NOTE — Progress Notes (Signed)
Family would like to donate pt kidneys

## 2015-08-04 NOTE — Progress Notes (Signed)
Graylin ShiverAlerted Tukov, NP that pt.'s pupils have dilated to #6 from a #3 @ 2000, still non-reactive. No new orders at this time, will continue to monitor pt. Closely.

## 2015-08-05 ENCOUNTER — Encounter: Admission: EM | Disposition: E | Payer: Self-pay | Source: Home / Self Care | Attending: Internal Medicine

## 2015-08-05 SURGERY — SURGICAL PROCUREMENT, ORGAN
Anesthesia: Choice

## 2015-08-06 LAB — URINE CULTURE: Culture: NO GROWTH

## 2015-08-07 LAB — CULTURE, BLOOD (ROUTINE X 2)
CULTURE: NO GROWTH
Culture: NO GROWTH

## 2015-08-07 LAB — CULTURE, RESPIRATORY W GRAM STAIN

## 2015-08-07 LAB — CULTURE, RESPIRATORY

## 2015-08-07 LAB — GLUCOSE, CAPILLARY: Glucose-Capillary: 269 mg/dL — ABNORMAL HIGH (ref 65–99)

## 2015-08-09 LAB — CULTURE, BLOOD (ROUTINE X 2)
CULTURE: NO GROWTH
Culture: NO GROWTH

## 2015-08-11 LAB — BLOOD GAS, ARTERIAL
ACID-BASE DEFICIT: 12.8 mmol/L — AB (ref 0.0–2.0)
ACID-BASE DEFICIT: 12.9 mmol/L — AB (ref 0.0–2.0)
ACID-BASE DEFICIT: 8.6 mmol/L — AB (ref 0.0–2.0)
ALLENS TEST (PASS/FAIL): POSITIVE — AB
Acid-base deficit: 10.9 mmol/L — ABNORMAL HIGH (ref 0.0–2.0)
Allens test (pass/fail): POSITIVE — AB
Allens test (pass/fail): POSITIVE — AB
Allens test (pass/fail): POSITIVE — AB
BICARBONATE: 18.7 meq/L — AB (ref 21.0–28.0)
BICARBONATE: 12.7 meq/L — AB (ref 21.0–28.0)
BICARBONATE: 14.2 meq/L — AB (ref 21.0–28.0)
Bicarbonate: 15.3 mEq/L — ABNORMAL LOW (ref 21.0–28.0)
FIO2: 1
FIO2: 1
FIO2: 1
FIO2: 40
LHR: 18 {breaths}/min
LHR: 30 {breaths}/min
MECHVT: 580 mL
MECHVT: 580 mL
O2 SAT: 86.8 %
O2 SAT: 99.5 %
O2 Saturation: 97.3 %
O2 Saturation: 100 %
PATIENT TEMPERATURE: 36.6
PATIENT TEMPERATURE: 37
PCO2 ART: 57 mmHg — AB (ref 32.0–48.0)
PCO2 ART: 28 mmHg — AB (ref 32.0–48.0)
PEEP/CPAP: 12 cmH2O
PEEP/CPAP: 12 cmH2O
PEEP: 12 cmH2O
PEEP: 12 cmH2O
PH ART: 7.26 — AB (ref 7.350–7.450)
PH ART: 7.38 (ref 7.350–7.450)
PO2 ART: 402 mmHg — AB (ref 83.0–108.0)
Patient temperature: 37.8
Patient temperature: 34.5
RATE: 22 resp/min
RATE: 30 resp/min
VT: 580 mL
VT: 580 mL
pCO2 arterial: 38 mmHg (ref 32.0–48.0)
pCO2 arterial: 24 mmHg — ABNORMAL LOW (ref 32.0–48.0)
pH, Arterial: 7.13 — CL (ref 7.350–7.450)
pH, Arterial: 7.2 — ABNORMAL LOW (ref 7.350–7.450)
pO2, Arterial: 73 mmHg — ABNORMAL LOW (ref 83.0–108.0)
pO2, Arterial: 101 mmHg (ref 83.0–108.0)
pO2, Arterial: 167 mmHg — ABNORMAL HIGH (ref 83.0–108.0)

## 2015-08-23 NOTE — Discharge Summary (Signed)
[facility] Critical Care Medicine Progess Note  Name: Dakota Smith MRN: [id number] DOB: 1975/11/23    ADMISSION DATE:  08/03/2015   Admission diagnosis Cardiac arrest-etiology unknown Acute hypoxic/hypercarbic respiratory failure Aspiration pneumonia AKI Metabolic and lactic acidosis UTI Shocked liver H/o ETOH abuse Hypokalemia Anoxic brain injury, cerebral edema and brain herniation and myoclonic sezures secondary to cardiac arrest  Discharge diagnosis Cardiac arrest-etiology unknown Acute hypoxic/hypercarbic respiratory failure Aspiration pneumonia AKI Metabolic and lactic acidosis UTI Shocked liver H/o ETOH abuse Hypokalemia Anoxic brain injury, cerebral edema and brain herniation and myoclonic sezures secondary to cardiac arrest Terminal ventilator wean  HISTORY OF PRESENT ILLNESS:  Dakota Smith is a 40 year old male with no prior significant medical history, is a tobacco abuser. Patient was brought to the ER off [facility] on 5/11 by EMS. Patient was found unresponsive at [facility] in [place]. When EMS arrived to the scene patient was in asystole. File department gave 2 mg of Narcan prior to EMS on the scene. EMS gave 4 mg of Narcan, 3 mg of epinephrine and 1 mg of atropine. CPR was initiated with return of spontaneous circulation in 15 minutes. Upon arrival to the ER his ABG 7.10/57/56/17.7 sodium-136, K-3.3, Troponins-<0.03, lactic acid 7.3,wbc -7.2, platelets- 241, Creatinine-1.47, BUN-16, UDS was negative for any substance abuse.  VITAL SIGNS: Temp:  [97.2 F (36.2 C)-98.6 F (37 C)] 98.6 F (37 C) (05/13 2000) Pulse Rate:  [33-98] 33 (05/13 2030) Resp:  [0-30] 0 (05/13 2030) BP: (75-124)/(56-92) 101/70 mmHg (05/13 2000) SpO2:  [42 %-100 %] 42 % (05/13 2030) FiO2 (%):  [30 %] 30 % (05/13 2000) Weight:  [191 lb 5.8 oz (86.8 kg)] 191 lb 5.8 oz (86.8 kg) (05/13 0446) HEMODYNAMICS: CVP:  [5 mmHg-28 mmHg] 6 mmHg VENTILATOR  SETTINGS: Vent Mode:  [-] PRVC FiO2 (%):  [30 %] 30 % Set Rate:  [30 bmp] 30 bmp Vt Set:  [580 mL] 580 mL PEEP:  [5 cmH20-8 cmH20] 5 cmH20 Plateau Pressure:  [22 cmH20] 22 cmH20 INTAKE / OUTPUT:  Intake/Output Summary (Last 24 hours) at 08/07/15 0013 Last data filed at 08/19/2015 2000  Gross per 24 hour  Intake 3941.17 ml  Output   5595 ml  Net -1653.83 ml    PHYSICAL EXAMINATION: Physical Examination:   VS: BP 101/70 mmHg  Pulse 33  Temp(Src) 98.6 F (37 C) (Core (Comment))  Resp 0  Ht  (1.803 m)  Wt 191 lb 5.8 oz (86.8 kg)  BMI 26.70 kg/m2  SpO2 42%  General Appearance: No distress , Sedated Neuro:without focal findings, mental status , Unresponsive HEENT: PERRLA, EOM intact. ET tube in place, No gag, no cough response on deep suctioning. Pulmonary: normal breath sounds  , no spontaneous respirations. Upon disconnecting the patient from the ventilator for 1 minute. CardiovascularNormal S1,S2.  No m/r/g.   Abdomen: Benign, Soft, non-tender. Renal:  No costovertebral tenderness  GU:  Not performed at this time. Endocrine: No evident thyromegaly. Skin:   warm, no rashes, no ecchymosis  Extremities: normal, no cyanosis, clubbing.   LABS:   LABORATORY PANEL:   CBC  Recent Labs Lab 08/06/2015 1442  WBC 15.6*  HGB 12.7*  HCT 37.6*  PLT 176    Chemistries   Recent Labs Lab 08/03/15 0530  08/22/2015 0455 08/20/2015 1442  NA 136  < > 140 145  K 3.8  < > 3.7 3.5  CL 106  < > 114* 119*  CO2 15*  < > 22 18*  GLUCOSE 190*  < > 149* 133*  BUN 24*  < > 22* 20  CREATININE 1.51*  < > 1.48* 1.63*  CALCIUM 8.6*  < > 8.4* 8.3*  MG 1.8  --   --   --   PHOS 3.2  --   --   --   AST  --   --  60*  --   ALT  --   --  46  --   ALKPHOS  --   --  60  --   BILITOT  --   --  0.5  --   < > = values in this interval not displayed.   Recent Labs Lab 08/03/15 1958 08/10/2015 0007 07/25/2015 0427 08/11/2015 0808 07/25/2015 1214 08/01/2015 1732  GLUCAP 126* 146* 143* 132*  135* 159*    Recent Labs Lab 08/03/15 0300 08/03/15 1045 07/25/2015 1442  PHART 7.26* 7.38 7.57*  PCO2ART 28* 24* 20*  PO2ART 402* 167* 74*    Recent Labs Lab 08/08/2015 1543 07/26/2015 0455  AST 70* 60*  ALT 64* 46  ALKPHOS 69 60  BILITOT 0.2* 0.5  ALBUMIN 4.1 3.1*    Cardiac Enzymes  Recent Labs Lab 08/03/15 1025  TROPONINI 3.27*    RADIOLOGY:  Ct Head Wo Contrast  07/25/2015  CLINICAL DATA:  Patient found unresponsive on 05-Aug-2015 EXAM: CT HEAD WITHOUT CONTRAST TECHNIQUE: Contiguous axial images were obtained from the base of the skull through the vertex without intravenous contrast. COMPARISON:  08/20/2015 FINDINGS: The paranasal sinuses are well aerated. There is a small amount of fluid in the inferior left mastoid air cells. The mastoid air cells are otherwise well aerated as are the middle ears. No fractures. Extracranial soft tissues are unremarkable. No subdural, epidural, or subarachnoid hemorrhage is identified. There is complete loss of gray-white differentiation, progressed in the interval, consistent with diffuse anoxic injury. There is low attenuation in the caudate heads and basal ganglia bilaterally, new in the interval, consistent with infarcts. Another small similar region is seen in the external capsule on the right. The ventricles are similar in the interval. There is partial compression of the frontal horns consistent with cerebral edema. The quadrigeminal plate and suprasellar cisterns are completely effaced which is a new finding. There is no midline shift or mass identified. The cerebellum and brainstem are stable. No other interval changes. IMPRESSION: 1. Diffuse cerebral edema and developing infarcts consistent with the history of anoxic injury. The findings have progressed since the comparison CT scan. There is now complete effacement of the basilar cisterns consistent with diffuse and severe cerebral edema. If the patient has not already begun to  herniate, he is certainly at very high risk. Findings called to the ICU physician, Dr. Ardyth Man Electronically Signed   By: Gerome Sam III M.D   On: 08/08/2015 09:57   Dg Chest Port 1 View  08/03/2015  CLINICAL DATA:  Respiratory failure EXAM: PORTABLE CHEST 1 VIEW COMPARISON:  Aug 05, 2015 FINDINGS: Endotracheal tube is 4.1 cm above the carina. The nasogastric tube extends into the stomach and beyond the inferior edge of the image. Hazy opacity in the central and basilar lung regions persists, probably unchanged from the previous day. No large effusion. IMPRESSION: Support equipment appears satisfactorily positioned. No significant interval change in the bilateral airspace opacities. Electronically Signed   By: Ellery Plunk M.D.   On: 08/03/2015 06:31   Hospital Course Patient presented in the ED post resuscitation in the field and was admitted  to the ICU. Hypothermia protocol was initiated and patient was completely re-warmed by 0930am. He was seen by neurology and a baseline CT head was obtained and reviewed prior to initiation of hypothermia.Based of results of the CT, and the presence of myoclonic seizures, he was started on keppra, lorazepam and vimpat.  Despite fluid resuscitation, he remained in cardiogenic shock hence pressors were started. He was on levophed and vasopressin for a MAP goal of 60-70. He was able to maintain his blood pressure with two pressors. He was also given broad spectrum antibiotics, IV fluids, and amiodarone for elevated HR. Post-hypothermia, all sedation was turned off but he remained unresponsive with no reflexes. He was taken off the vent during morning rounds for about 1 minute and he had no spontaneous breaths. A repeat CT head showed diffuse severe cerebral edema and anoxic injury. He was re-evaluated by neurology. After discussion with the family regarding patient's prognosis, he was made DNR, comfort care and the family decided on a terminal wean this evening.    Discharge plan Patient terminally extubated and expired at 2028. Body handed taken to the morgue; awaiting autopsy

## 2015-08-23 DEATH — deceased

## 2016-08-09 IMAGING — CT CT HEAD W/O CM
1 of 2 series · 13 of 30 positions shown, 17 images · non-contrast
Comparison: August 02, 2015

CLINICAL DATA: Patient found unresponsive on August 02, 2015

EXAM:
CT HEAD WITHOUT CONTRAST
TECHNIQUE: Contiguous axial images were obtained from the base of the skull
through the vertex without intravenous contrast.

[Series 2: head wo · axial · 0.45mm/px · z∈[-21,+104]mm · 13 of 31 slices shown, 17 images]
[im 3/31  brain]
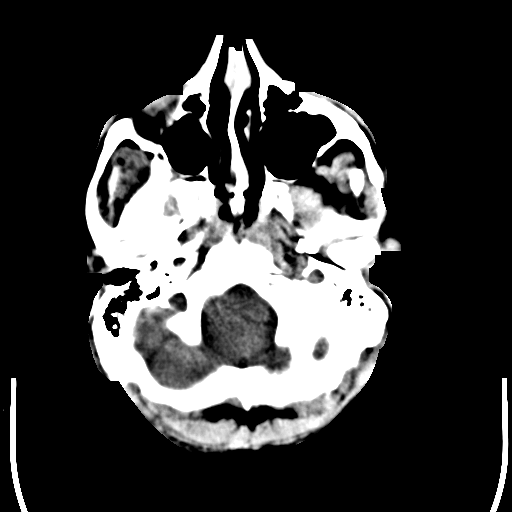
[im 3/31  bone]
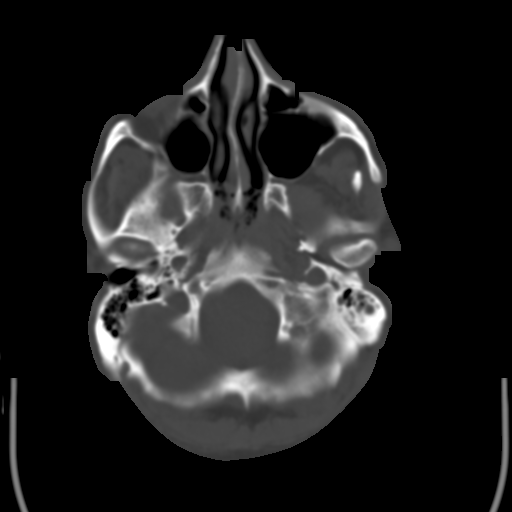
[im 5/31  brain]
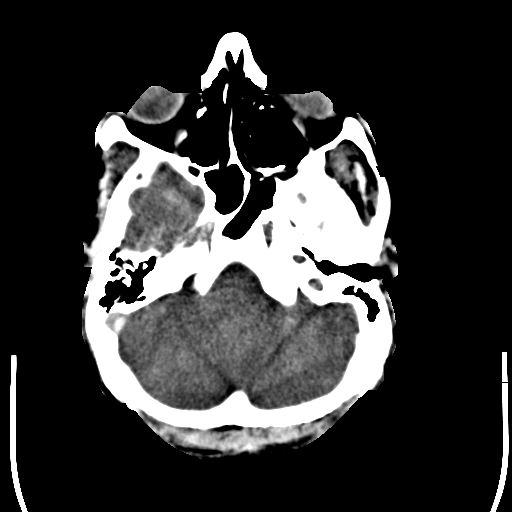
[im 7/31  brain]
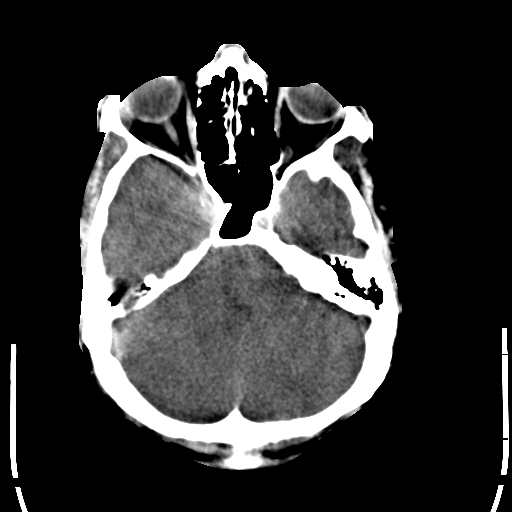
[im 9/31  brain]
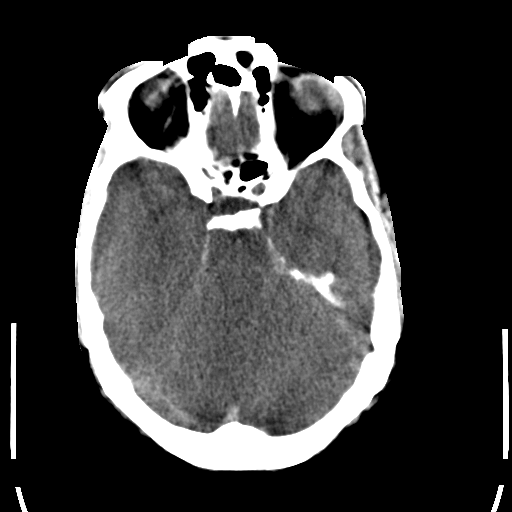
[im 11/31  brain]
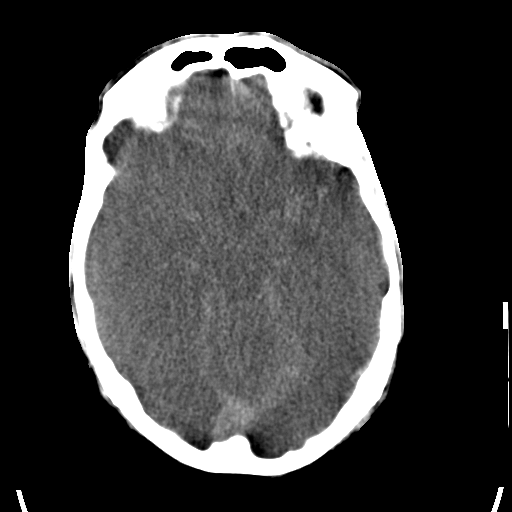
[im 11/31  bone]
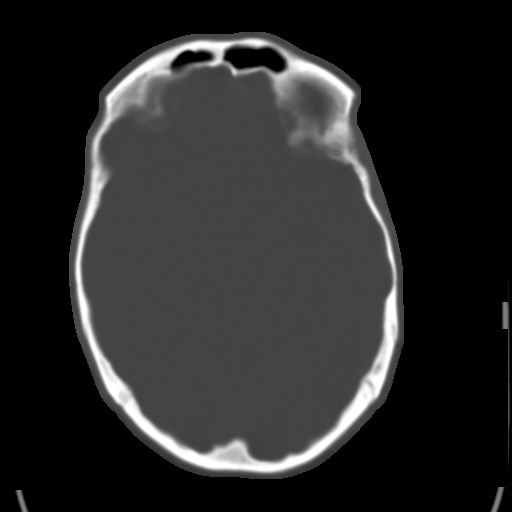
[im 13/31  brain]
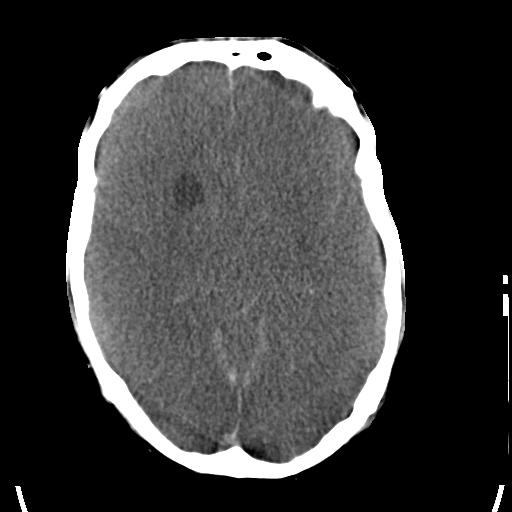
[im 16/31  brain]
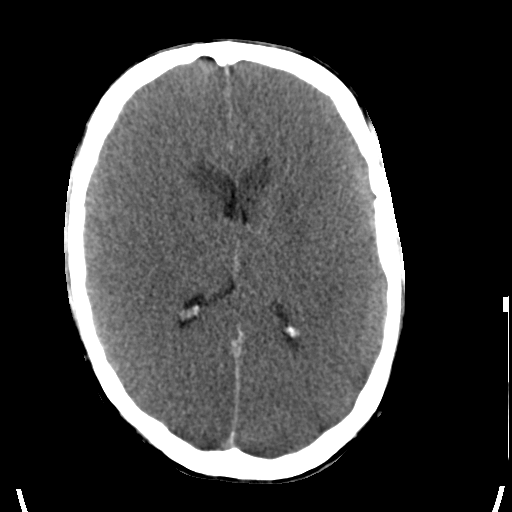
[im 18/31  brain]
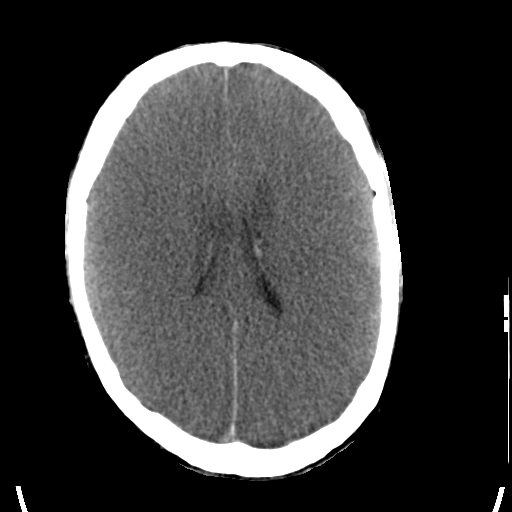
[im 20/31  brain]
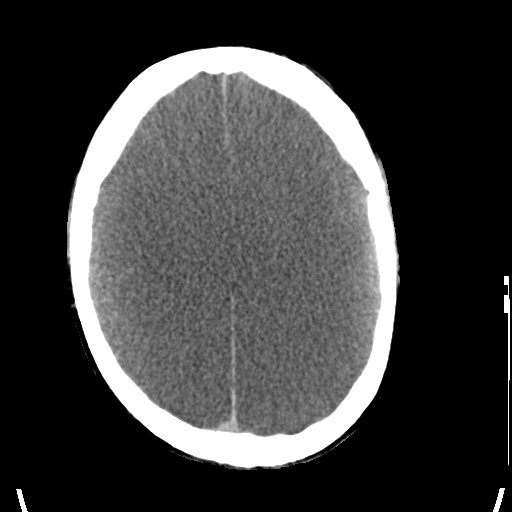
[im 20/31  bone]
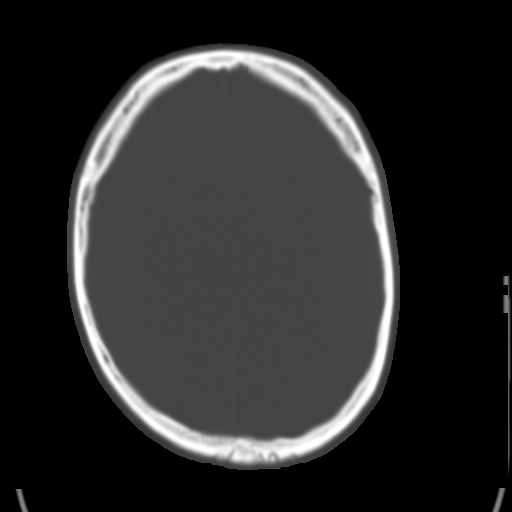
[im 22/31  brain]
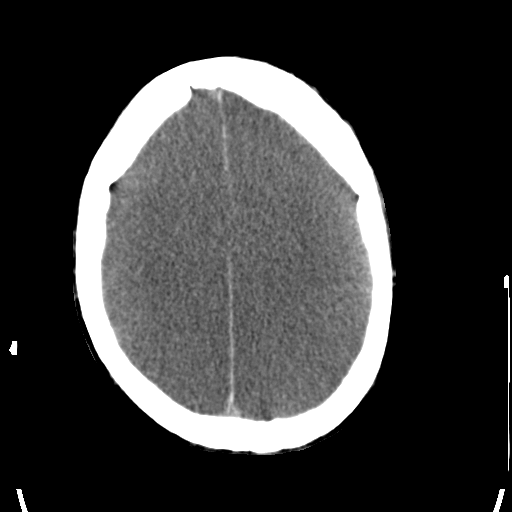
[im 24/31  brain]
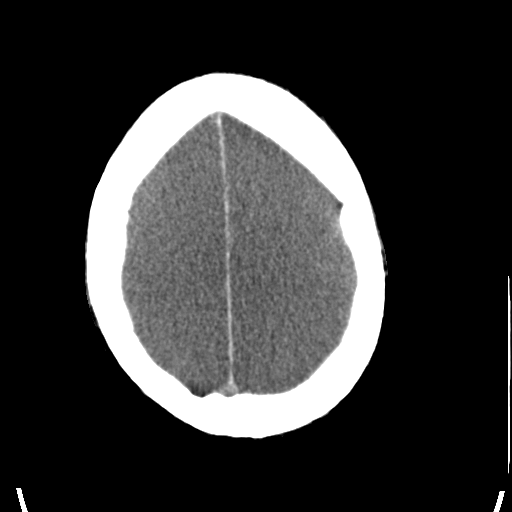
[im 26/31  brain]
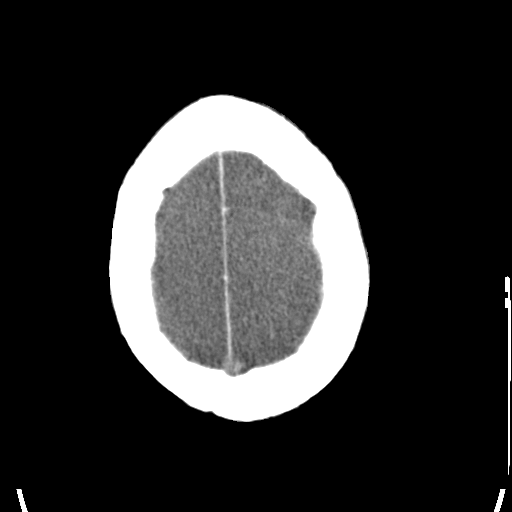
[im 28/31  brain]
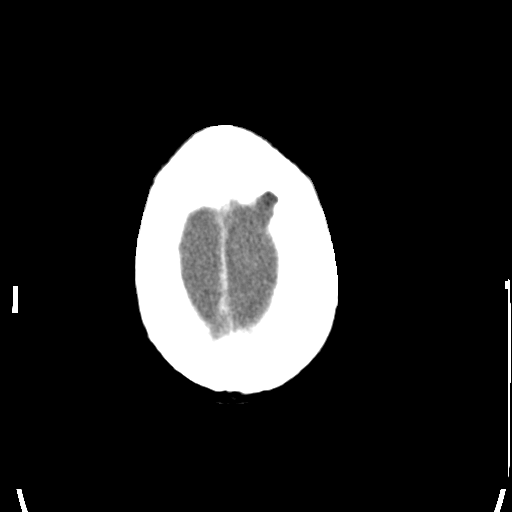
[im 28/31  bone]
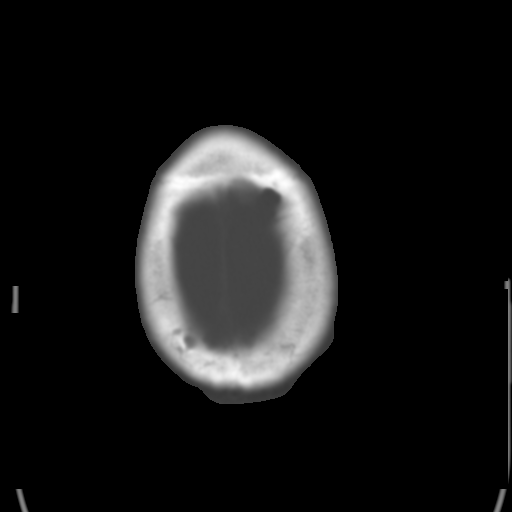

[13 of 30 positions shown; findings below may reference images not displayed]

FINDINGS: The paranasal sinuses are well aerated. There is a small amount of
fluid in the inferior left mastoid air cells. The mastoid air cells
are otherwise well aerated as are the middle ears. No fractures.
Extracranial soft tissues are unremarkable. No subdural, epidural,
or subarachnoid hemorrhage is identified. There is complete loss of
gray-white differentiation, progressed in the interval, consistent
with diffuse anoxic injury. There is low attenuation in the caudate
heads and basal ganglia bilaterally, new in the interval, consistent
with infarcts. Another small similar region is seen in the external
capsule on the right. The ventricles are similar in the interval.
There is partial compression of the frontal horns consistent with
cerebral edema. The quadrigeminal plate and suprasellar cisterns are
completely effaced which is a new finding. There is no midline shift
or mass identified. The cerebellum and brainstem are stable. No
other interval changes.
IMPRESSION: 1. Diffuse cerebral edema and developing infarcts consistent with
the history of anoxic injury. The findings have progressed since the
comparison CT scan. There is now complete effacement of the basilar
cisterns consistent with diffuse and severe cerebral edema. If the
patient has not already begun to herniate, he is certainly at very
high risk.
Findings called to the ICU physician, Dr. Pansiri
